# Patient Record
Sex: Female | Born: 1940 | Race: White | Hispanic: No
Health system: Midwestern US, Community
[De-identification: ages and names within clinical notes are randomized; demographics above are authoritative.]

## PROBLEM LIST (undated history)

## (undated) DIAGNOSIS — R339 Retention of urine, unspecified: Secondary | ICD-10-CM

## (undated) DIAGNOSIS — N9489 Other specified conditions associated with female genital organs and menstrual cycle: Secondary | ICD-10-CM

## (undated) DIAGNOSIS — J449 Chronic obstructive pulmonary disease, unspecified: Secondary | ICD-10-CM

## (undated) DIAGNOSIS — K5900 Constipation, unspecified: Secondary | ICD-10-CM

## (undated) DIAGNOSIS — M1990 Unspecified osteoarthritis, unspecified site: Secondary | ICD-10-CM

## (undated) DIAGNOSIS — R011 Cardiac murmur, unspecified: Secondary | ICD-10-CM

## (undated) DIAGNOSIS — M549 Dorsalgia, unspecified: Secondary | ICD-10-CM

## (undated) HISTORY — DX: Chronic obstructive pulmonary disease, unspecified: J44.9

## (undated) HISTORY — DX: Dorsalgia, unspecified: M54.9

## (undated) HISTORY — PX: PARTIAL COLECTOMY: SHX5273

## (undated) HISTORY — PX: LUMBAR SPINE SURGERY: SHX701

## (undated) HISTORY — PX: EPIDURAL BLOCK INJECTION: SHX1516

## (undated) HISTORY — PX: HEMORROIDECTOMY: SUR656

---

## 2007-11-03 ENCOUNTER — Encounter: Payer: Self-pay | Admitting: Internal Medicine

## 2007-11-07 ENCOUNTER — Ambulatory Visit (HOSPITAL_COMMUNITY): Admission: RE | Admit: 2007-11-07 | Discharge: 2007-11-07 | Payer: Self-pay | Admitting: Interventional Radiology

## 2007-11-07 ENCOUNTER — Encounter (INDEPENDENT_AMBULATORY_CARE_PROVIDER_SITE_OTHER): Payer: Self-pay | Admitting: Interventional Radiology

## 2008-04-27 ENCOUNTER — Ambulatory Visit (HOSPITAL_COMMUNITY): Admission: RE | Admit: 2008-04-27 | Discharge: 2008-04-27 | Payer: Self-pay | Admitting: Urology

## 2009-04-25 ENCOUNTER — Ambulatory Visit (HOSPITAL_COMMUNITY): Admission: RE | Admit: 2009-04-25 | Discharge: 2009-04-25 | Payer: Self-pay | Admitting: Interventional Radiology

## 2009-05-02 ENCOUNTER — Ambulatory Visit (HOSPITAL_COMMUNITY): Admission: RE | Admit: 2009-05-02 | Discharge: 2009-05-02 | Payer: Self-pay | Admitting: Interventional Radiology

## 2010-02-05 ENCOUNTER — Encounter: Payer: Self-pay | Admitting: Interventional Radiology

## 2010-03-29 ENCOUNTER — Ambulatory Visit (INDEPENDENT_AMBULATORY_CARE_PROVIDER_SITE_OTHER): Payer: Medicare Other | Admitting: Internal Medicine

## 2010-03-29 DIAGNOSIS — R1032 Left lower quadrant pain: Secondary | ICD-10-CM

## 2010-03-29 DIAGNOSIS — R1031 Right lower quadrant pain: Secondary | ICD-10-CM

## 2010-04-11 ENCOUNTER — Ambulatory Visit (HOSPITAL_COMMUNITY)
Admission: RE | Admit: 2010-04-11 | Discharge: 2010-04-11 | Disposition: A | Discharge status: Home / Self Care | Payer: Medicare Other | Source: Ambulatory Visit | Attending: Internal Medicine | Admitting: Internal Medicine

## 2010-04-11 ENCOUNTER — Encounter (HOSPITAL_BASED_OUTPATIENT_CLINIC_OR_DEPARTMENT_OTHER): Payer: Medicare Other | Admitting: Internal Medicine

## 2010-04-11 ENCOUNTER — Ambulatory Visit (HOSPITAL_COMMUNITY): Payer: Medicare Other

## 2010-04-11 DIAGNOSIS — E785 Hyperlipidemia, unspecified: Secondary | ICD-10-CM | POA: Insufficient documentation

## 2010-04-11 DIAGNOSIS — R1031 Right lower quadrant pain: Secondary | ICD-10-CM | POA: Insufficient documentation

## 2010-04-11 DIAGNOSIS — Z9049 Acquired absence of other specified parts of digestive tract: Secondary | ICD-10-CM | POA: Insufficient documentation

## 2010-04-11 DIAGNOSIS — K644 Residual hemorrhoidal skin tags: Secondary | ICD-10-CM | POA: Insufficient documentation

## 2010-04-11 DIAGNOSIS — R1032 Left lower quadrant pain: Secondary | ICD-10-CM

## 2010-04-11 DIAGNOSIS — R198 Other specified symptoms and signs involving the digestive system and abdomen: Secondary | ICD-10-CM

## 2010-04-14 ENCOUNTER — Other Ambulatory Visit (HOSPITAL_COMMUNITY): Payer: Medicare Other

## 2010-04-18 ENCOUNTER — Ambulatory Visit (INDEPENDENT_AMBULATORY_CARE_PROVIDER_SITE_OTHER): Payer: Medicare Other | Admitting: Internal Medicine

## 2010-04-23 NOTE — Op Note (Signed)
Jody [name], Jody [name]               ACCOUNT NO.:  [id number]  MEDICAL RECORD NO.:  [id number]           PATIENT TYPE:  O  LOCATION:  DAYP                          FACILITY:  [facility]  PHYSICIAN:  Lionel December, M.D.    DATE OF BIRTH:  08-Jan-1941  DATE OF PROCEDURE:  04/11/2010 DATE OF DISCHARGE:                              OPERATIVE REPORT   PROCEDURE:  Flexible sigmoidoscopy.  INDICATIONS:  Jody [name] is a 70 year old Caucasian female who had a subdural colectomy for chronic constipation about 2-1/2 half years ago who has been experiencing recurrent left lower quadrant abdominal pain and she is having multiple bowel movements.  She has had a couple of abdominal and pelvic CTs, which have been unremarkable.  She is undergoing diagnostic examination to make sure she does not have anastomotic stricture, which might explain some or all of her symptoms. Procedures were reviewed with the patient.  Informed consent was obtained.  MEDICATIONS FOR CONSCIOUS SEDATION:  Demerol 25 mg IV, Versed 2 mg IV.  FINDINGS:  Procedure performed in endoscopy suite.  The patient's vital signs and O2 sat were monitored during the procedure and remained stable.  The patient was placed in left lateral recumbent position. Rectal examination was performed.  She complained of significant pain. I therefore decided to give her Versed and Demerol at this point.  Even though she complained of pain, no mass or stricture was noted on digital exam.  Pentax videoscope was placed through rectum.  Mucosa of the rectum was partly colored with formed stool.  Scope was advanced into distal sigmoid colon.  Ileocolonic anastomosis was easily identified and was wide open.  The distal small bowel mucosa was unremarkable and formed stool was also noted in distal small bowel.  As the scope was withdrawn, colonic mucosa was examined as best as possible.  Examination was limited because of presence of formed stool coating the  mucosa. Please note that the patient was given 2 Fleet enemas before she came to facility.  Back in the rectum, I was able to retroflex the scope to examine anorectal junction and hemorrhoids noted below the dentate line. Endoscope was withdrawn.  The patient tolerated the procedure well.  FINAL DIAGNOSES: 1. Patent ileocolonic anastomosis, located just past the rectosigmoid     junction. 2. Formed stool in the distal small bowel as well as rectum indicating     poor evacuation or impaired defecation. 3. External hemorrhoids. 4. I suspect the patient's pain is referred pain from her back.  Need     to rule out nerve root entrapment, etc.  RECOMMENDATIONS:  She will resume her usual medications.  High-fiber diet plus fiber supplement 3-4 g daily.  The patient advised to take 1 mg Imodium after her first bowel movement every morning.  We will schedule for MRI of lumbar spine without contrast and plan to see her back in the office after this study has been completed.     Lionel December, M.D.     NR/MEDQ  D:  04/11/2010  T:  04/11/2010  Job:  [id number]  cc:   Kristian Covey, [facility]  Electronically Signed  by Lionel December M.D. on 04/23/2010 02:00:19 PM

## 2010-05-22 NOTE — Consult Note (Signed)
Jody [name], Jody [name]               ACCOUNT NO.:  [id number]  MEDICAL RECORD NO.:  [id number]           PATIENT TYPE:  LOCATION:                                 FACILITY:  PHYSICIAN:  Lionel December, M.D.    DATE OF BIRTH:  December 01, 1940  DATE OF CONSULTATION: DATE OF DISCHARGE:                                CONSULTATION   REFERRING PHYSICIAN:  Ernst Breach, PA-C in [place].  REASON FOR CONSULTATION:  Chronic abdominal pain and this is an office visit.  HISTORY OF PRESENT ILLNESS:  Jody [name] is a 70 year old female presenting today as a referral from Kristian Covey, PA-C for chronic abdominal pain.  Jody [name] has had this left lower quadrant pain for over 2 years since undergoing a colectomy in 2009 by Dr. Cleotis Nipper.  She underwent an ileosigmoid anastomosis for obstipation.  She was actually seen in March 2011 for the same pain and she did undergo a CT of the abdomen and pelvis on January 06, 2010, which revealed increased moderate left hydronephrosis and ureterectasis of distal ureter, mild urothelial enhancement near the left ureterovesical junction may represent an obstructing lesion.  She was followed up with Dr. Lionel December.  Her last colonoscopy was on January 26, 2006 by Dr. Cleotis Nipper, which revealed a normal rectum.  Normal colon to the splenic flexure. She had a tortuous and redundant sigmoid colon.  She says her appetite is good.  Her acid reflux when she has it is controlled with milk of magnesia.  She usually has three stools a day and they are formed and normal.  She denies any melena or rectal bleeding.  She says she has had some weight loss.  She does describe her abdominal pain as a pulling sensation.  In September 2011, she saw Dr. Cristy Folks in [place] for her pain. In his notes, all of her ultrasounds and CTs revealed no abnormalities.  HOME MEDICATIONS:  Lorazepam 1 mg at night and alendronate 70 mg once a week.  PHYSICAL EXAMINATION:  VITAL SIGNS:  Her  weight is 99, her last weight in our office on March 26, 2010 was 105.  She has had a 6-pound weight loss.  Her height is 5 feet and 6 inches, her temperature is 97.6, her blood pressure is 88/58 and her pulse is 76. HEENT:  She is edentulous.  Her oral mucosa is moist.  There are no lesions.  Her conjunctivae is pink.  Her sclerae are anicteric.  Her thyroid is normal. NECK:  There is no cervical lymphadenopathy. LUNGS:  Her lungs are clear. ABDOMEN:  Her abdomen is soft.  Bowel sounds are positive.  There is no guarding on palpation of her abdomen.  She does say there is tenderness just to the left of her umbilicus.  There is no edema to her lower extremities.  On January 06, 2010, her glucose was 128, calcium 9.1, total bilirubin was 0.6, AST 20, ALT 14, ALP 54, total protein 6.8, albumin 4.2, amylase 62, lipase 25.  WBC count was 9.2, hemoglobin 13.4, hematocrit was 40.9, MCV was 91.8.  She will call with a progress report in  2 weeks.  ASSESSMENT:  Jody [name] is a 70 year old female with complaints of chronic left lower abdominal pain ever since undergoing her surgery in 2009.  She did have an ileosigmoid anastomosis by Dr. Cleotis Nipper for obstipation.  This appears to be a chronic lower abdominal pain.  RECOMMENDATIONS:  I would like for her to may be try Dicyclomine 10 mgs  before breakfast and evening meal and I will discuss this case with  Dr. Karilyn Cota in the week.  The patient does not appear to be in any distress.    ______________________________ Dorene Ar, NP   ______________________________ Lionel December, M.D.    TS/MEDQ  D:  03/29/2010  T:  03/29/2010  Job:  [id number]  Electronically Signed by Dorene Ar PA on 05/15/2010 09:44:25 AM Electronically Signed by Lionel December M.D. on 05/22/2010 12:16:51 PM

## 2010-05-30 NOTE — Consult Note (Signed)
NAMENAJAE, FILSAIME               ACCOUNT NO.:  [id number]   MEDICAL RECORD NO.:  [id number]          PATIENT TYPE:  OUT   LOCATION:  XRAY                         FACILITY:  [facility]   PHYSICIAN:  Sanjeev K. Deveshwar, M.D.DATE OF BIRTH:  08-Feb-1940   DATE OF CONSULTATION:  11/03/2007  DATE OF DISCHARGE:                                 CONSULTATION   CHIEF COMPLAINT:  L3 compression fracture.   HISTORY OF PRESENT ILLNESS:  This is a pleasant 70 year old female  referred to Dr. Corliss Skains through the courtesy of Dr. Gerhard Munch for  evaluation of back pain.  The patient states at approximately 1 month  ago, she bent over to pick up her vacuum cleaner and heard a loud pop  after which she developed severe back pain.  She had an MRI performed on  October 10, 2007, that did show an L3 compression fracture felt to be  acute or subacute, this had associated paraspinal muscle edema.  She was  also noted to have degenerative disk disease at L5-S1.  The patient  states the pain radiates down her back and into her right hip.  She has  been very sedentary secondary to the pain.  She has been unable to do  much of anything other than ambulate short distances.  She initially was  treated with Lortab, however, this caused severe constipation.  She now  takes Darvocet one b.i.d., but states that this provides very little  relief from her pain.  She has been unable to sleep or do any household  chores.  She presents today accompanied by her husband and son to be  evaluated by Dr. Corliss Skains.   PAST MEDICAL HISTORY:  The patient has a history of ongoing tobacco use.  She reports hypotension.  She has occasional headaches.  She has COPD  and gastroesophageal reflux disease.  She states she has an enlarged  gallbladder and uterus.  She has had some dysuria.  She has a history of  a heart murmur.  She states she has Rh negative blood with a history of  being a free bleeder.  She does have an indwelling PICC  line.  She has  osteoporosis.  She had back surgery 20 years ago.  She also had recent  colon surgery in April and denies any bleeding problems associated with  that surgery.   SURGICAL HISTORY:  The patient had colon surgery in April of this year.  She has had left ankle surgery, left wrist surgery, and back surgery.  She denies any previous problems with anesthesia.   ALLERGIES:  1. PENICILLIN.  2. MORPHINE.  3. CECLOR.  4. CIPRO.  5. SULFA.  6. CODEINE.   She reports a recent reaction to what she believes with was DYE during  her MRI.  They consisted of a burning sensation of the skin and hives.   CURRENT MEDICATIONS:  Kapidex, Claritin p.r.n., Fosamax every Friday,  and Lortab was stopped.  She does take Darvocet and lorazepam.   SOCIAL HISTORY:  The patient is married.  She has 4 children.  She and  her husband live  in [place].  She has a history of ongoing tobacco use.  She smokes just less than a pack a day and has done so since age 16.  She is on disability.   FAMILY HISTORY:  Mother died at age 70, from what the patient describes  is dehydration.  Her father is alive at age 4.  His health status is  unknown.   IMPRESSION AND PLAN:  As noted, the patient presents today for further  evaluation of an L3 compression fracture, which has caused severe pain  and disability.  Dr. Corliss Skains reviewed the results of her MRI, which  was performed at [facility].  He pointed out the compression  fracture to the patient and her family.  He felt that she would be an  appropriate candidate for a kyphoplasty.   The kyphoplasty procedure was described in great detail along with the  associated risks and benefits as well as other treatment options such as  a more conservative therapy with sedentary lifestyle and pain  medication.  The patient and her family were shown videos describing  osteoporosis, compression fractures, and the kyphoplasty procedure.  They were also given  some written materials to review at home.  All of  their questions were answered.  The patient would like to proceed with  the kyphoplasty, which has tentatively been scheduled for this Friday,  November 07, 2007.  Dr. Corliss Skains would like to check a clotting time due  to the patient's history of free bleeding.  She may also need  prophylaxis for a history of MRI DYE allergy.   Greater than 40 minutes was spent on this consult.      Delton See, P.A.    ______________________________  Grandville Silos. Corliss Skains, M.D.    DR/MEDQ  D:  11/03/2007  T:  11/04/2007  Job:  [id number]

## 2010-06-20 ENCOUNTER — Other Ambulatory Visit (HOSPITAL_COMMUNITY): Payer: Self-pay | Admitting: Interventional Radiology

## 2010-06-20 DIAGNOSIS — M549 Dorsalgia, unspecified: Secondary | ICD-10-CM

## 2010-06-23 ENCOUNTER — Ambulatory Visit (HOSPITAL_COMMUNITY)
Admission: RE | Admit: 2010-06-23 | Discharge: 2010-06-23 | Disposition: A | Discharge status: Home / Self Care | Payer: Medicare Other | Source: Ambulatory Visit | Attending: Interventional Radiology | Admitting: Interventional Radiology

## 2010-06-23 ENCOUNTER — Other Ambulatory Visit (HOSPITAL_COMMUNITY): Payer: Self-pay | Admitting: Interventional Radiology

## 2010-06-23 DIAGNOSIS — M549 Dorsalgia, unspecified: Secondary | ICD-10-CM

## 2010-06-27 ENCOUNTER — Other Ambulatory Visit (HOSPITAL_COMMUNITY): Payer: Self-pay | Admitting: Interventional Radiology

## 2010-06-27 ENCOUNTER — Ambulatory Visit (HOSPITAL_COMMUNITY)
Admission: RE | Admit: 2010-06-27 | Discharge: 2010-06-27 | Disposition: A | Discharge status: Home / Self Care | Payer: Medicare Other | Source: Ambulatory Visit | Attending: Interventional Radiology | Admitting: Interventional Radiology

## 2010-06-27 DIAGNOSIS — I700 Atherosclerosis of aorta: Secondary | ICD-10-CM | POA: Insufficient documentation

## 2010-06-27 DIAGNOSIS — M79609 Pain in unspecified limb: Secondary | ICD-10-CM | POA: Insufficient documentation

## 2010-06-27 DIAGNOSIS — K7689 Other specified diseases of liver: Secondary | ICD-10-CM | POA: Insufficient documentation

## 2010-06-27 DIAGNOSIS — R109 Unspecified abdominal pain: Secondary | ICD-10-CM | POA: Insufficient documentation

## 2010-06-27 LAB — CREATININE, SERUM
Creatinine, Ser: 0.64 mg/dL (ref 0.4–1.2)
GFR calc Af Amer: >60 mL/min
GFR calc non Af Amer: >60 mL/min

## 2010-06-27 LAB — BUN: BUN: 22 mg/dL (ref 6–23)

## 2010-06-27 MED ORDER — IOHEXOL 300 MG/ML  SOLN
150.0000 mL | Freq: Once | INTRAMUSCULAR | Status: AC | PRN
  Administered 2010-06-27: 150 mL via INTRAVENOUS

## 2010-06-30 ENCOUNTER — Other Ambulatory Visit (HOSPITAL_COMMUNITY): Payer: Self-pay | Admitting: Interventional Radiology

## 2010-06-30 DIAGNOSIS — M549 Dorsalgia, unspecified: Secondary | ICD-10-CM

## 2010-07-07 ENCOUNTER — Ambulatory Visit (HOSPITAL_COMMUNITY): Payer: Medicare Other

## 2010-07-07 ENCOUNTER — Ambulatory Visit (HOSPITAL_COMMUNITY)
Admission: RE | Admit: 2010-07-07 | Discharge: 2010-07-07 | Disposition: A | Discharge status: Home / Self Care | Payer: Medicare Other | Source: Ambulatory Visit | Attending: Interventional Radiology | Admitting: Interventional Radiology

## 2010-07-07 DIAGNOSIS — M549 Dorsalgia, unspecified: Secondary | ICD-10-CM

## 2010-07-07 DIAGNOSIS — M519 Unspecified thoracic, thoracolumbar and lumbosacral intervertebral disc disorder: Secondary | ICD-10-CM | POA: Insufficient documentation

## 2010-07-07 DIAGNOSIS — M51379 Other intervertebral disc degeneration, lumbosacral region without mention of lumbar back pain or lower extremity pain: Secondary | ICD-10-CM | POA: Insufficient documentation

## 2010-07-07 DIAGNOSIS — M5126 Other intervertebral disc displacement, lumbar region: Secondary | ICD-10-CM | POA: Insufficient documentation

## 2010-07-07 DIAGNOSIS — M5137 Other intervertebral disc degeneration, lumbosacral region: Secondary | ICD-10-CM | POA: Insufficient documentation

## 2010-10-17 LAB — BASIC METABOLIC PANEL
CO2: 28
Chloride: 105
GFR calc Af Amer: >60
Glucose, Bld: 90
Potassium: 4.3
Sodium: 139

## 2010-10-17 LAB — CBC
HCT: 40.8
Hemoglobin: 13.6
MCHC: 33.4
MCV: 91.9
RDW: 14.8
WBC: 3.3 — ABNORMAL LOW

## 2010-10-17 LAB — PROTIME-INR: INR: 1

## 2010-12-11 ENCOUNTER — Telehealth (INDEPENDENT_AMBULATORY_CARE_PROVIDER_SITE_OTHER): Payer: Self-pay | Admitting: *Deleted

## 2010-12-11 NOTE — Telephone Encounter (Signed)
Needs to talk to Terri. Please return the call to [phone]. She has a question about her tcs that was done in March of 2012.

## 2010-12-11 NOTE — Telephone Encounter (Signed)
This is for Dr. Rehman 

## 2010-12-11 NOTE — Telephone Encounter (Signed)
I called the patient and she states that she had Colonoscopy in March. Son told her that Dr. Karilyn Cota told her she did not have cancer but that she had  a disc problem with her back and would need a MRI. She went to see to [facility] and they placed cement in her back. States that Camelia Eng called her a few months ago and told she needed to see a Careers adviser for hemorrhoids. She saw Dr. Doylene Canning and he refused to take care of her because she let Dr. Karilyn Cota do her Colonoscopy. He told her to come back to Dr. Karilyn Cota, patient states that she has not heard anything from Dr. Karilyn Cota since procedure. Patient c/o horrible pain (rectal) and sit on a heating pad all the time to help with this problem.

## 2010-12-11 NOTE — Telephone Encounter (Signed)
This is for Dr. Karilyn Cota. I have not seen this patient.

## 2010-12-12 ENCOUNTER — Ambulatory Visit (INDEPENDENT_AMBULATORY_CARE_PROVIDER_SITE_OTHER): Payer: Self-pay | Admitting: Surgery

## 2010-12-12 NOTE — Telephone Encounter (Signed)
Scheduled an apt for 12/19/10 at 10:00 am.

## 2010-12-19 ENCOUNTER — Ambulatory Visit (INDEPENDENT_AMBULATORY_CARE_PROVIDER_SITE_OTHER): Payer: Medicare Other | Admitting: Internal Medicine

## 2011-01-24 ENCOUNTER — Ambulatory Visit (INDEPENDENT_AMBULATORY_CARE_PROVIDER_SITE_OTHER): Payer: Medicare Other | Admitting: Internal Medicine

## 2011-01-24 ENCOUNTER — Encounter (INDEPENDENT_AMBULATORY_CARE_PROVIDER_SITE_OTHER): Payer: Self-pay | Admitting: Internal Medicine

## 2011-01-24 DIAGNOSIS — J449 Chronic obstructive pulmonary disease, unspecified: Secondary | ICD-10-CM | POA: Insufficient documentation

## 2011-01-24 DIAGNOSIS — M549 Dorsalgia, unspecified: Secondary | ICD-10-CM | POA: Insufficient documentation

## 2011-01-24 DIAGNOSIS — R1013 Epigastric pain: Secondary | ICD-10-CM

## 2011-01-24 MED ORDER — OMEPRAZOLE 20 MG PO CPDR: 20.0000 mg | DELAYED_RELEASE_CAPSULE | Freq: Two times a day (BID) | ORAL | Status: DC

## 2011-01-24 NOTE — Patient Instructions (Addendum)
H.pylori today. Omeprazole 20mg  Bid. Continue Preparation H. Progress report in 2 weeks.

## 2011-01-24 NOTE — Progress Notes (Addendum)
Subjective:     Patient ID: Jody [name], female   DOB: 01/12/1941, 71 y.o.   MRN: [id number]  HPI Jody [name] is a 71 yr old female presenting with c/o epigastric pain and umbilical pain.  She also tells me her hemorrhoids are giving her problems. She has been using Preparation H which has helped. Yesterday she had 7 BMs. Some were formed and some were loose. She has had 2 stools today and were formed. Hemorrhoids are better today. Appetite   04/11/2010 Flexible Sigmoid: Dr. Karilyn Cota:   FINAL DIAGNOSES:  1. Patent ileocolonic anastomosis, located just past the rectosigmoid  junction.  2. Formed stool in the distal small bowel as well as rectum indicating  poor evacuation or impaired defecation.  3. External hemorrhoids.  4. I suspect the patient's pain is referred pain from her back. Need  to rule out nerve root entrapment, etc.   2009 Dr. Cleotis Nipper: ileosigmoid anastomosis for obstipation.  September 2011 Dr. Cristy Folks for abdominal pain: [place] and CT revealed no abnormality. Review of Systems see hpi Current Outpatient Prescriptions  Medication Sig Dispense Refill  . LORazepam (ATIVAN) 1 MG tablet Take 1 mg by mouth Nightly.       Past Surgical History  Procedure Date  . Epidural block injection     a few ago in [place]  . Partial colectomy     for constipation   History   Social History  . Marital Status: Legally Separated    Spouse Name: N/A    Number of Children: N/A  . Years of Education: N/A   Occupational History  . Not on file.   Social History Main Topics  . Smoking status: Current Everyday Smoker  . Smokeless tobacco: Not on file   Comment: 1/2 pack a day since age 27.  Marland Kitchen Alcohol Use: No  . Drug Use: No  . Sexually Active: Not on file   Other Topics Concern  . Not on file   Social History Narrative  . No narrative on file   Family Status  Relation Status Death Age  . Mother Deceased     dehydration  . Father Deceased     prostate cancer  . Sister Deceased      1/2 sister with colon cancer age 53  . Brother Alive     1/2 brother in good health. One brother was murdered   Allergies  Allergen Reactions  . Ceclor (Cefaclor)   . Codeine   . Dimetapp (Albertsons Di Bromm)   . Penicillins   . Sulfa Antibiotics            Objective:   Physical Exam  Filed Vitals:   01/24/11 1439  Height: 5\' 6"  (1.676 m)  Weight: 92 lb 8 oz (41.958 kg)    Alert and oriented. Skin warm and dry. Oral mucosa is moist.   . Sclera anicteric, conjunctivae is pink. Thyroid not enlarged. No cervical lymphadenopathy. Lungs clear. Heart regular rate and rhythm.  Abdomen is soft. Bowel sounds are positive. No hepatomegaly. No abdominal masses felt. Epigastric and pain umbilicus.  Stool brown and guaiac negative.   No edema to lower extremities. Patient is alert and oriented.     Assessment:    Epigastric pain probably GERD.       Plan:    Omeprazole 20mg  BID.  ,. Continue the Preparation H.  Will also check an H. Pylori on her today.  Progress report in 2 weeks.

## 2011-01-25 LAB — H. PYLORI ANTIBODY, IGG: H Pylori IgG: 0.51 {ISR}

## 2011-01-26 ENCOUNTER — Other Ambulatory Visit (INDEPENDENT_AMBULATORY_CARE_PROVIDER_SITE_OTHER): Payer: Self-pay | Admitting: Internal Medicine

## 2011-01-26 DIAGNOSIS — R350 Frequency of micturition: Secondary | ICD-10-CM

## 2011-01-26 MED ORDER — CIPROFLOXACIN HCL 500 MG PO TABS: 500.0000 mg | ORAL_TABLET | Freq: Two times a day (BID) | ORAL | Status: AC

## 2015-02-25 ENCOUNTER — Other Ambulatory Visit: Payer: Self-pay | Admitting: Neurosurgery

## 2015-02-25 DIAGNOSIS — M47816 Spondylosis without myelopathy or radiculopathy, lumbar region: Secondary | ICD-10-CM

## 2015-03-21 ENCOUNTER — Other Ambulatory Visit: Payer: Medicare Other

## 2016-02-07 ENCOUNTER — Encounter (INDEPENDENT_AMBULATORY_CARE_PROVIDER_SITE_OTHER): Payer: Self-pay | Admitting: Internal Medicine

## 2016-02-07 ENCOUNTER — Encounter (INDEPENDENT_AMBULATORY_CARE_PROVIDER_SITE_OTHER): Payer: Self-pay

## 2016-02-16 ENCOUNTER — Encounter (INDEPENDENT_AMBULATORY_CARE_PROVIDER_SITE_OTHER): Payer: Self-pay | Admitting: Internal Medicine

## 2016-02-16 ENCOUNTER — Ambulatory Visit (INDEPENDENT_AMBULATORY_CARE_PROVIDER_SITE_OTHER): Payer: Medicare Other | Admitting: Internal Medicine

## 2016-02-16 VITALS — BP 94/60 | HR 72 | Temp 98.6°F | Ht 65.0 in | Wt 106.6 lb

## 2016-02-16 DIAGNOSIS — K6289 Other specified diseases of anus and rectum: Secondary | ICD-10-CM | POA: Diagnosis not present

## 2016-02-16 NOTE — Progress Notes (Addendum)
   Subjective:    Patient ID: Jody [name], female    DOB: 12-25-1940, 76 y.o.   MRN: [id number]  HPI Referred by Dr. Wenda Overland for rectal pain . Nine to 10 years she had a subtotal colectomy for constipation. . She says Dr. Lindalou Hose did the surgery. She say Dr. Ladona Horns and had an EGD. (I will get  Those records).  Has seen Dr. Britta Mccreedy in the past with an extensive workup. Per Dr. Britta Mccreedy records hx of chronic constipation and underwent a subtotal colectomy 7 yrs. Marland Kitchen  Hx of Melnick Fraser Syndrome. She tells me when she has a BM, she swells in her rectum. She has pain when she walks. She has a BM every other day.  Appetite is good. She is not on a stool softener. She underwent a flex sigmoid 09/02/2015. There was evidence of prior surgical anastomosis in the rectum.  A normal descending, sigmoid, and rectum appeared norma.  02/10/2016 H and H 14.5 and 44.5 11/15/2015 US pelvis LLQ pain: Normal pelvic US   10/31/2013 EGD: abdominal pain: Dr. Sherlene Shams Normal.  A subepithelial lesion was located 16-18 cm from the incisors.  Retroflexed views revealed a hiatal hernia.  Review of Systems Past Medical History:  Diagnosis Date  . Back pain   . COPD (chronic obstructive pulmonary disease) ([place])     Past Surgical History:  Procedure Laterality Date  . EPIDURAL BLOCK INJECTION     a few ago in [place]  . PARTIAL COLECTOMY     for constipation    Allergies  Allergen Reactions  . Ceclor [Cefaclor]   . Codeine   . Dimetapp [Albertsons Di Bromm]   . Morphine And Related   . Penicillins   . Sulfa Antibiotics     Current Outpatient Prescriptions on File Prior to Visit  Medication Sig Dispense Refill  . LORazepam (ATIVAN) 1 MG tablet Take 1 mg by mouth Nightly.    Marland Kitchen omeprazole (PRILOSEC) 20 MG capsule Take 1 capsule (20 mg total) by mouth 2 (two) times daily. 60 capsule 3   No current facility-administered medications on file prior to visit.        Objective:   Physical Exam Blood  pressure 94/60, pulse 72, temperature 98.6 F (37 C), height 5\' 5"  (1.651 m), weight 106 lb 9.6 oz (48.4 kg).  Alert and oriented. Skin warm and dry. Oral mucosa is moist.   . Sclera anicteric, conjunctivae is pink. Thyroid not enlarged. No cervical lymphadenopathy. Lungs clear. Heart regular rate and rhythm.  Abdomen is soft. Bowel sounds are positive. No hepatomegaly. No abdominal masses felt. No tenderness.  No edema to lower extremities. Stool brown and guaiac negative.       Assessment & Plan:  Rectal pain. Stool was brown and guaiac negative. Has had this pain for year. Advised to use rectal cream that Dr. Wenda Overland prescribed. Am going to get records from Dr. Britta Mccreedy.

## 2016-02-16 NOTE — Patient Instructions (Signed)
Will get records from Dr. Britta Mccreedy.

## 2016-02-20 ENCOUNTER — Encounter (INDEPENDENT_AMBULATORY_CARE_PROVIDER_SITE_OTHER): Payer: Self-pay | Admitting: Internal Medicine

## 2016-02-29 ENCOUNTER — Ambulatory Visit (INDEPENDENT_AMBULATORY_CARE_PROVIDER_SITE_OTHER): Payer: Medicare Other | Admitting: Urology

## 2016-02-29 DIAGNOSIS — N3020 Other chronic cystitis without hematuria: Secondary | ICD-10-CM

## 2016-02-29 DIAGNOSIS — R339 Retention of urine, unspecified: Secondary | ICD-10-CM

## 2016-03-28 ENCOUNTER — Other Ambulatory Visit (HOSPITAL_COMMUNITY)
Admission: RE | Admit: 2016-03-28 | Discharge: 2016-03-28 | Disposition: A | Discharge status: Home / Self Care | Payer: Medicare Other | Source: Other Acute Inpatient Hospital | Attending: Urology | Admitting: Urology

## 2016-03-28 ENCOUNTER — Ambulatory Visit (INDEPENDENT_AMBULATORY_CARE_PROVIDER_SITE_OTHER): Payer: Medicare Other | Admitting: Urology

## 2016-03-28 DIAGNOSIS — R339 Retention of urine, unspecified: Secondary | ICD-10-CM | POA: Diagnosis not present

## 2016-03-28 DIAGNOSIS — N3000 Acute cystitis without hematuria: Secondary | ICD-10-CM

## 2016-03-31 LAB — URINE CULTURE

## 2016-07-23 ENCOUNTER — Ambulatory Visit: Admit: 2016-07-23 | Discharge: 2016-07-23 | Payer: MEDICARE | Attending: Urology

## 2016-07-23 DIAGNOSIS — R300 Dysuria: Secondary | ICD-10-CM

## 2016-07-23 LAB — AMB POC URINALYSIS DIP STICK AUTO W/ MICRO (MICRO RESULTS)
Bilirubin (UA POC): NEGATIVE
Blood (UA POC): NEGATIVE
Crystals (UA POC): NEGATIVE
Epithelial cells (UA POC): 0
Glucose (UA POC): NEGATIVE
Ketones (UA POC): NEGATIVE
Nitrites (UA POC): NEGATIVE
Protein (UA POC): NEGATIVE
RBCs (UA POC): 0
Specific gravity (UA POC): 1.02 (ref 1.001–1.035)
Urobilinogen (UA POC): 0.2 (ref 0.2–1)
WBCs (UA POC): 0
pH (UA POC): 6 (ref 4.6–8.0)

## 2016-07-23 NOTE — Progress Notes (Signed)
Kimberly Sherman is a 76 y.o. female presents today for a straight catheter insertion for urine culture per Dr. Cleda MccreedyHughart order.   Dr. Cleda MccreedyHughart was present in the clinic as incident to.     Visit Vitals   ??? BP (!) 80/60 (BP 1 Location: Left arm, BP Patient Position: Sitting)   ??? Pulse 68   ??? Temp 96.1 ??F (35.6 ??C) (Temporal)   ??? Resp 18   ??? Ht 5\' 7"  (1.702 m)   ??? Wt 93 lb (42.2 kg)   ??? BMI 14.57 kg/m2        Procedure:   The genital and perineal areas were cleansed with antiseptic solution on the cotton balls.  With clean gloves the area was cleansed with antiseptic solution again.  I applied sterile lubricant liberally to the catheter tip, lubricating at least six inches of the catheter.  A 14 fr cath catheter was inserted into the urinary meatus. When urine started to flow, I inserted the catheter approximately one inch further without difficulty and bladder drained.      Catheter size:  14  Type:  Straight-tip   Material:  All Silicone    Urine was:   Cloudy  Specimen obtained:  YES    No results found for this or any previous visit.    Orders Placed This Encounter   ??? INSERT,NON-INDWELLING BLADDER CATHETER       Nadeen LandauNakia S Alira Fretwell

## 2016-07-23 NOTE — Progress Notes (Deleted)
.  techu

## 2016-07-23 NOTE — Progress Notes (Signed)
HISTORY OF PRESENT ILLNESS:  Kimberly Sherman is a 76 y.o. female who is seen in consultation as referred for c/o hesitancy. 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 Hx of microhematuria. Cysto and retrograde pyelograms done March 2017 was neg. Hx of renal failure. Pt reports that she saw a urologist in [place] approx 2 months ago and had a catheter placed, but was taught to do CIC TID. Pt reports that she has issues doing CIC because "it hurts." Has had problems with her bladder since her colectomy. Pt will be seeing GI this month.     Past Medical History:   Diagnosis Date   ??? Arthritis    ??? Bulging lumbar disc    ??? Chronic obstructive pulmonary disease (HCC)    ??? Depression    ??? GERD (gastroesophageal reflux disease)    ??? Hemorrhoids    ??? Urinary retention        Past Surgical History:   Procedure Laterality Date   ??? HX HEMORRHOIDECTOMY     ??? PART REMOVAL COLON W ANASTOMOSIS         Social History   Substance Use Topics   ??? Smoking status: Current Every Day Smoker     Packs/day: 1.00     Years: 52.00   ??? Smokeless tobacco: Never Used   ??? Alcohol use No       Allergies   Allergen Reactions   ??? Codeine Hives   ??? Dimetapp (Brompheniramine-Ppa) Unable to Obtain   ??? Penicillins Hives   ??? Sulfa (Sulfonamide Antibiotics) Nausea and Vomiting     No family history on file.    Current Outpatient Prescriptions   Medication Sig Dispense Refill   ??? promethazine (PHENERGAN) 50 mg tablet Take 50 mg by mouth every six (6) hours as needed for Nausea.     ??? IBUPROFEN PO Take 1 Tab by mouth three (3) times daily.     ??? propoxyphene nap/acetaminophen (DARVOCET-N 100 PO) Take 1 Tab by mouth two (2) times a day.     ??? raNITIdine (ZANTAC) 150 mg tablet Take 150 mg by mouth two (2) times a day.         Review of Systems  Constitutional: Fever: No  Skin: Rash: No  HEENT: Hearing difficulty: No   Eyes: Blurred vision: No  Cardiovascular: Chest pain: No  Respiratory: Shortness of breath: Yes  chronic  Gastrointestinal: Nausea/vomiting: No  Musculoskeletal: Back pain: No  Neurological: Weakness: No  Psychological: Memory loss: No  Comments/additional findings:       PHYSICAL EXAMINATION:     Visit Vitals   ??? BP (!) 80/60 (BP 1 Location: Left arm, BP Patient Position: Sitting)   ??? Pulse 68   ??? Temp 96.1 ??F (35.6 ??C) (Temporal)   ??? Resp 18   ??? Ht 5\' 7"  (1.702 m)   ??? Wt 93 lb (42.2 kg)   ??? BMI 14.57 kg/m2     Constitutional: Well developed, no acute distress.   Eyes:  Conjunctiva normal.  Ears:  External ear normal.  Nose/Throat:  External nose normal.  CV:  Heart rate regular. No peripheral swelling noted.  Respiratory: No respiratory distress. No audible wheeze.  Abdomen:  Soft, nontender, nondistended, no mass/organomegaly.  Vascular:  Abdominal Aorta nonpalpable.  Skin: No rash. No ulcer.    Neuro/Psych:  Patient with appropriate affect.  Alert and oriented x 3.    Gait:  Normal.  GU Female:      CVA: non-tender bilaterally. Bladder not palpable.      REVIEW OF LABS AND IMAGING:      Results for orders placed or performed in visit on 07/23/16   AMB POC URINALYSIS DIP STICK AUTO W/ MICRO (MICRO RESULTS)   Result Value Ref Range    Color (UA POC) Dark Yellow     Clarity (UA POC) Clear     Glucose (UA POC) Negative Negative    Bilirubin (UA POC) Negative Negative    Ketones (UA POC) Negative Negative    Specific gravity (UA POC) 1.020 1.001 - 1.035    Blood (UA POC) Negative Negative    pH (UA POC) 6.0 4.6 - 8.0    Protein (UA POC) Negative Negative    Urobilinogen (UA POC) 0.2 mg/dL 0.2 - 1    Nitrites (UA POC) Negative Negative    Leukocyte esterase (UA POC) 1+ Negative    Epithelial cells (UA POC) 0     WBCs (UA POC) 0      RBCs (UA POC) 0      Bacteria (UA POC) 3+ Negative    Crystals (UA POC) Negative Negative    Other (UA POC)     AMB POC PVR, MEAS,POST-VOID RES,US,NON-IMAGING   Result Value Ref Range     PVR 257 cc       ASSESSMENT:     ICD-10-CM ICD-9-CM    1. Dysuria R30.0 788.1 INSERT,NON-INDWELLING BLADDER CATHETER      AMB POC URINALYSIS DIP STICK AUTO W/ MICRO (MICRO RESULTS)      CUSTOMER REQUEST URINE CULTURE      CANCELED: INSERT,NON-INDWELLING BLADDER CATHETER   2. Neurogenic bladder N31.9 596.54 AMB POC PVR, MEAS,POST-VOID RES,US,NON-IMAGING   3. Retention of urine R33.9 788.20         PLAN:    ?? Offered to insert foley until Urodynamics since pt finds it difficult to do CIC, but pt does not want  ?? Cont CIC QID  ?? Schedule for Urodynamics   ?? Will try to get urology records    Patient's BMI is out of the normal parameters. Information about BMI was given and patient was advised to follow-up with their PCP for further management.    Chief Complaint   Patient presents with   ??? Urinary Retention     Medical documentation provided with the assistance of Isac SarnaKimber N Moore, medical scribe for [employer] LifeChristi Dashonna Chagnon, DO, [address]     Izack Hoogland [place], [place]

## 2016-07-24 ENCOUNTER — Encounter

## 2016-07-25 LAB — AMB POC PVR, MEAS,POST-VOID RES,US,NON-IMAGING: PVR: 257 cc

## 2016-07-25 NOTE — Telephone Encounter (Signed)
[facility] called for Knute NeuShirley Salasar, asking for a verbal for Ms. Cocuzza Straight catheters. Per Dr. Cleda MccreedyHughart she was given a one-month supply of Speedi-catheters.

## 2016-08-01 ENCOUNTER — Ambulatory Visit: Admit: 2016-08-01 | Discharge: 2016-08-01 | Payer: MEDICARE

## 2016-08-01 DIAGNOSIS — R339 Retention of urine, unspecified: Secondary | ICD-10-CM

## 2016-08-01 NOTE — Telephone Encounter (Signed)
Patient called asking why is her bladder not working and was expecting to have cystoscopy done today instead UDS. Patient was told that she was scheduled to look at her bladder neurology if its still working and will discus  the results with Dr. Cleda MccreedyHughart the day of her appointment.

## 2016-08-01 NOTE — Progress Notes (Addendum)
[facility]  Without Video- Fluoroscopy    Name: Kimberly LovelyShirley A Mates   Date: 08/01/2016      Kimberly Sherman is a 76 y.o. female who presents today for evaluation. Patient has been referred by Dr. Cleda MccreedyHughart.  Dr. Netta CorriganHughar is available in clinic as the incident to provider.        ICD-10-CM ICD-9-CM    1. Urinary retention R33.9 788.20 PR ANAL/URINARY MUSCLE STUDY      PR COMPLEX CYSTOMETROGRAM      CANCELED: PR COMPLEX CYSTOMETROGRAM VOIDING PRESSURE STUDIES      CANCELED: PR ELECTRO-UROFLOWMETRY, FIRST      CANCELED: PR VOIDING PRESS STUDY INTRA-ABDOMINAL VOID   2. Neurogenic bladder N31.9 596.54 PR ANAL/URINARY MUSCLE STUDY      PR COMPLEX CYSTOMETROGRAM      CANCELED: PR COMPLEX CYSTOMETROGRAM VOIDING PRESSURE STUDIES      CANCELED: PR ELECTRO-UROFLOWMETRY, FIRST      CANCELED: PR VOIDING PRESS STUDY INTRA-ABDOMINAL VOID     Orders Placed This Encounter   ??? ([id number]) - PR ANAL/URINARY MUSCLE STUDY   ??? ([id number]) - PR COMPLEX CYSTOMETROGRAM       Past Medical History:   Diagnosis Date   ??? Arthritis    ??? Bulging lumbar disc    ??? Chronic obstructive pulmonary disease (HCC)    ??? Depression    ??? GERD (gastroesophageal reflux disease)    ??? Hemorrhoids    ??? Urinary retention       Past Surgical History:   Procedure Laterality Date   ??? HX HEMORRHOIDECTOMY     ??? PART REMOVAL COLON W ANASTOMOSIS         Urine dipstick shows negative for all components.    Voiding Diary: not completed by patient            Uroflometry  Voided Volume 0 ml                   Post Void Residual 120 ml   Comments: PVR via catheter       The genital and perineal areas were cleansed with benzalkonium chloride and patient was catheterized with a 14 fr Straight-tip  catheter to assess PVR and absence of urinary tract infection.     A #7 fr Straight-tip urodynamics catheter was inserted into the bladder to infuse fluid and assess bladder activity and voiding parameters.  A #9 fr  catheter was inserted into the rectum to assess straining during voiding. Both catheters were connected to water filled lines to transducers that simultaneously measure pressure generated inside and outside the bladder during filling and voiding as well as resulting flow volume and time.      Three patch electrodes are placed, one ground and two perianally which are used to measure the electrical activity of the external urinary sphincter during filling and emptying the bladder.      Infusion with sterile water was started and the patient was asked to voice sensations of pressure, urge and bladder fullness, followed by instructions to void.     Results for orders placed or performed in visit on 07/23/16   AMB POC URINALYSIS DIP STICK AUTO W/ MICRO (MICRO RESULTS)   Result Value Ref Range    Color (UA POC) Dark Yellow     Clarity (UA POC) Clear     Glucose (UA POC) Negative Negative    Bilirubin (UA POC) Negative Negative    Ketones (UA POC) Negative Negative  Specific gravity (UA POC) 1.020 1.001 - 1.035    Blood (UA POC) Negative Negative    pH (UA POC) 6.0 4.6 - 8.0    Protein (UA POC) Negative Negative    Urobilinogen (UA POC) 0.2 mg/dL 0.2 - 1    Nitrites (UA POC) Negative Negative    Leukocyte esterase (UA POC) 1+ Negative    Epithelial cells (UA POC) 0     WBCs (UA POC) 0      RBCs (UA POC) 0      Bacteria (UA POC) 3+ Negative    Crystals (UA POC) Negative Negative    Other (UA POC)     AMB POC PVR, MEAS,POST-VOID RES,US,NON-IMAGING   Result Value Ref Range    PVR 257 cc        Orders Placed This Encounter   ??? ([id number]) - PR ANAL/URINARY MUSCLE STUDY   ??? ([id number]) - PR COMPLEX CYSTOMETROGRAM         Cystometrogram  Sensation Delayed   First sensation 154 ml   Strong desire 184 ml   Capacity 243 ml   Compliance: normal     Detrusor overactivity:yes     Leakage: no       Micturition  Voided Volume: 0 ml                                          Post Void Residual: 260 ml - via catheter      Comments: Patient reports that she has issues doing CIC because "it hurts." also her "pee hole" is swollen.  Per [facility] patient has a small capacity compliant bladder with delayed sensations.  DO noted during fill at 17 ml/H2O, 155 ml/H2O and 213 ml/H2O with no leakage.  Patient was not able to generate a bladder pressure detrusor contraction and was not able to void and she had an active EUS.    The Patient Instruction Sheet was reviewed with and given to patient who expressed understanding.  1 Cipor 500 ml tablet given PO to patient with instructions per UDS protocol.      Etheleen Sia, [place]        Follow-up Disposition:  Return in about 9 days (around 08/10/2016).   Reviewed by [facility], DO.      I have read the above study, reviewed the tracings, the clinical history, and agree with the findings as above.     Study of sufficient quality:Yes     Cysto completed: Yes Findings: Normal, 2017    Relevant prior surgery or neurologic disease:Yes, Pelvic surgery (colon)    Patient has small capacity bladder with detrusor overactivity noted (without leakage).  She came in with a relatively modest PVR of 120 cc, but during the study could not mount a detrusor contraction and left 240 cc remaining after her study.      Given the lack of ability to show a detrusor contraction, normal cystoscopy indicating no urethral stenosis or anatomic reason for obstruction, would suspect bladder hypoactivity as a cause of her retention.  Cannot exclude the possibility of poor bladder neck relaxation but without a detrusor contraction, impossible to determine.  Since she has little other options other than CIC or indwelling Foley if retention persists, could consider off label use of an alpha blocker in a female to aid bladder neck relaxation, and pelvic floor physiotherapy for her poor sphincter relaxation.  But again, even if she had issues of poor bladder neck relaxation and poor sphincter relaxation, without a detrusor  contraction, even correction of those issues, retention would likely persist.    Claudius Sis, MD

## 2016-08-10 ENCOUNTER — Encounter: Attending: Urology

## 2016-08-10 ENCOUNTER — Ambulatory Visit: Admit: 2016-08-10 | Discharge: 2016-08-10 | Payer: MEDICARE | Attending: Urology

## 2016-08-10 DIAGNOSIS — N319 Neuromuscular dysfunction of bladder, unspecified: Secondary | ICD-10-CM

## 2016-08-10 NOTE — Progress Notes (Signed)
Kimberly Sherman is a 76 y.o. female presents today for a straight catheter insertion for urine culture per Dr. Cleda MccreedyHughart order.   Dr. Cleda MccreedyHughart was present in the clinic as incident to.     Visit Vitals   ??? BP 104/66 (BP 1 Location: Left arm, BP Patient Position: Sitting)   ??? Pulse 71   ??? Temp 99 ??F (37.2 ??C) (Temporal)   ??? Resp 16   ??? Ht 5\' 6"  (1.676 m)   ??? Wt 89 lb (40.4 kg)   ??? LMP  (LMP Unknown)   ??? BMI 14.36 kg/m2        Procedure:   The genital and perineal areas were cleansed with antiseptic solution on the cotton balls.  With clean gloves the area was cleansed with antiseptic solution again.  I applied sterile lubricant liberally to the catheter tip, lubricating at least six inches of the catheter.  A 12 fr cath catheter was inserted into the urinary meatus. When urine started to flow, I inserted the catheter approximately one inch further without difficulty and bladder drained.      Catheter size:  12  Type:  Straight-tip   Material:  All Silicone    Urine was:   clear  Specimen obtained:  YES    Results for orders placed or performed in visit on 07/23/16   AMB POC URINALYSIS DIP STICK AUTO W/ MICRO (MICRO RESULTS)   Result Value Ref Range    Color (UA POC) Dark Yellow     Clarity (UA POC) Clear     Glucose (UA POC) Negative Negative    Bilirubin (UA POC) Negative Negative    Ketones (UA POC) Negative Negative    Specific gravity (UA POC) 1.020 1.001 - 1.035    Blood (UA POC) Negative Negative    pH (UA POC) 6.0 4.6 - 8.0    Protein (UA POC) Negative Negative    Urobilinogen (UA POC) 0.2 mg/dL 0.2 - 1    Nitrites (UA POC) Negative Negative    Leukocyte esterase (UA POC) 1+ Negative    Epithelial cells (UA POC) 0     WBCs (UA POC) 0      RBCs (UA POC) 0      Bacteria (UA POC) 3+ Negative    Crystals (UA POC) Negative Negative    Other (UA POC)     AMB POC PVR, MEAS,POST-VOID RES,US,NON-IMAGING   Result Value Ref Range    PVR 257 cc       Orders Placed This Encounter   ??? INSERT,NON-INDWELLING BLADDER CATHETER    ??? gabapentin (NEURONTIN) 300 mg capsule     Sig: Take 300 mg by mouth.   ??? promethazine (PHENERGAN) 25 mg tablet     Sig: Take 25 mg by mouth.       [employer]i Decie Verne, LPN

## 2016-08-10 NOTE — Progress Notes (Signed)
HISTORY OF PRESENT ILLNESS:  Kimberly Sherman is a 76 y.o. female who presents today for c/o hesitancy. 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 Hx of microhematuria. Cysto and retrograde pyelograms done March 2017 was neg. Hx of renal failure. Pt reports that she saw [place]ist in [place] approx 2 months ago and had a catheter placed, but was taught to do CIC TID. Pt reports that she has issues doing CIC because "it hurts." Has had problems with her bladder since her colectomy. Pt will be seeing GI this month.     UDS shows small capacity bladder, DO, no detrusor contraction.  Suspect may be due to pelvic surgery.  Only option is to continue cic.  Patient only doing cic bid.  No dysuria.  Patient saw GI recently as referred.      AUA Symptom Score 08/10/2016   Over the past month how often have you had the sensation that your bladder was not completely empty after you finished urinating? 2   Over the past month, how often have had to urinate again less than 2 hours after you last finished urinating? 2   Over the past month, how often have you found you stopped and started again several times when you urinated? 2   Over the past month, how often have you found it difficult to postpone urination? 0   Over the past month, how often have you had a weak urinary stream? 2   Over the past month, how often have you had to push or strain to begin urinating? 0   Over the past month, how many times did you most typically get up to urinate from the time you went to bed at night until the time you got up in the morning? 2   AUA Score 10   If you were to spend the rest of your life with your urinary condition the way it is now, how would you feel about that? Mostly satisfied       Past Medical History:   Diagnosis Date   ??? Arthritis    ??? Bulging lumbar disc     ??? Chronic obstructive pulmonary disease (HCC)    ??? Depression    ??? GERD (gastroesophageal reflux disease)    ??? Hemorrhoids    ??? Urinary retention        Past Surgical History:   Procedure Laterality Date   ??? HX HEMORRHOIDECTOMY     ??? PART REMOVAL COLON W ANASTOMOSIS         Social History   Substance Use Topics   ??? Smoking status: Current Every Day Smoker     Packs/day: 1.00     Years: 52.00   ??? Smokeless tobacco: Never Used   ??? Alcohol use No       Allergies   Allergen Reactions   ??? Codeine Hives   ??? Dimetapp (Brompheniramine-Ppa) Unable to Obtain   ??? Penicillins Hives   ??? Sulfa (Sulfonamide Antibiotics) Nausea and Vomiting       Family History   Problem Relation Age of Onset   ??? Cancer Mother    ??? Cancer Sister    ??? Cancer Daughter        Current Outpatient Prescriptions   Medication Sig Dispense Refill   ??? gabapentin (NEURONTIN) 300 mg capsule Take 300 mg  by mouth.     ??? promethazine (PHENERGAN) 25 mg tablet Take 25 mg by mouth.     ??? promethazine (PHENERGAN) 50 mg tablet Take 50 mg by mouth every six (6) hours as needed for Nausea.     ??? IBUPROFEN PO Take 1 Tab by mouth three (3) times daily.     ??? raNITIdine (ZANTAC) 150 mg tablet Take 150 mg by mouth two (2) times a day.     ??? propoxyphene nap/acetaminophen (DARVOCET-N 100 PO) Take 1 Tab by mouth two (2) times a day.           REVIEW OF SYSTEMS:  Constitutional: Fever: No  Skin: Rash: No  HEENT: Hearing difficulty: No  Eyes: Blurred vision: No  Cardiovascular: Chest pain: No  Respiratory: Shortness of breath: No  Gastrointestinal: Nausea/vomiting: Yes  Musculoskeletal: Back pain: No  Neurological: Weakness: No  Psychological: Memory loss: No    PHYSICAL EXAMINATION:   Visit Vitals   ??? BP 104/66 (BP 1 Location: Left arm, BP Patient Position: Sitting)   ??? Pulse 71   ??? Temp 99 ??F (37.2 ??C) (Temporal)   ??? Resp 16   ??? Ht 5\' 6"  (1.676 m)   ??? Wt 89 lb (40.4 kg)   ??? LMP  (LMP Unknown)   ??? BMI 14.36 kg/m2     Constitutional: Well developed, no acute distress.    Eyes:  Conjunctiva normal.  Ears:  External ear normal.  Nose/Throat:  External nose normal.  CV:  Heart rate regular, No peripheral swelling noted.  Respiratory: No respiratory distress, No audible wheeze.  Skin: No rash, No ulcer.    Neuro/Psych:  Patient with appropriate affect.  Alert and oriented x 3.    Gait:  Normal.     REVIEW OF LABS AND IMAGING:      Results for orders placed or performed in visit on 07/23/16   AMB POC URINALYSIS DIP STICK AUTO W/ MICRO (MICRO RESULTS)   Result Value Ref Range    Color (UA POC) Dark Yellow     Clarity (UA POC) Clear     Glucose (UA POC) Negative Negative    Bilirubin (UA POC) Negative Negative    Ketones (UA POC) Negative Negative    Specific gravity (UA POC) 1.020 1.001 - 1.035    Blood (UA POC) Negative Negative    pH (UA POC) 6.0 4.6 - 8.0    Protein (UA POC) Negative Negative    Urobilinogen (UA POC) 0.2 mg/dL 0.2 - 1    Nitrites (UA POC) Negative Negative    Leukocyte esterase (UA POC) 1+ Negative    Epithelial cells (UA POC) 0     WBCs (UA POC) 0      RBCs (UA POC) 0      Bacteria (UA POC) 3+ Negative    Crystals (UA POC) Negative Negative    Other (UA POC)     AMB POC PVR, MEAS,POST-VOID RES,US,NON-IMAGING   Result Value Ref Range    PVR 257 cc       ASSESSMENT:     ICD-10-CM ICD-9-CM    1. Neurogenic bladder N31.9 596.54 INSERT,NON-INDWELLING BLADDER CATHETER   2. Retention of urine R33.9 788.20    3. Weight loss R63.4 783.21               PLAN:    ?? Told to increase cic to qid  ?? Culture sent but asymptomatic  ?? Told only option is to continue cic  ??  F/u 3 wks to see if needs anticholinergic for spasms between cic  ?? Told to see pcp regarding weight loss  Patient's BMI is out of the normal parameters.  Information about BMI was given and patient was advised to follow-up with their PCP for further management.           Chief Complaint   Patient presents with   ??? (LUTS) Lower Urinary Tract Symptoms     UDS results          A copy of today's office visit was sent to the referring physician.        Medical documentation provided with the assistance of Isac Sarna, medical scribe for [employer], DO, [place].    Duard Spiewak [place], DO

## 2016-08-14 NOTE — Progress Notes (Signed)
asympt so no tx

## 2016-08-16 ENCOUNTER — Encounter: Attending: Urology

## 2016-09-05 ENCOUNTER — Encounter: Attending: Urology

## 2017-12-15 DIAGNOSIS — K5900 Constipation, unspecified: Secondary | ICD-10-CM

## 2017-12-15 HISTORY — DX: Constipation, unspecified: K59.00

## 2017-12-20 ENCOUNTER — Observation Stay (HOSPITAL_COMMUNITY): Payer: Medicare Other

## 2017-12-20 ENCOUNTER — Other Ambulatory Visit: Payer: Self-pay

## 2017-12-20 ENCOUNTER — Encounter (HOSPITAL_COMMUNITY): Payer: Self-pay

## 2017-12-20 ENCOUNTER — Observation Stay (HOSPITAL_COMMUNITY)
Admission: EM | Admit: 2017-12-20 | Discharge: 2017-12-21 | Disposition: A | Discharge status: Home / Self Care | Payer: Medicare Other | Attending: Internal Medicine | Admitting: Internal Medicine

## 2017-12-20 ENCOUNTER — Emergency Department (HOSPITAL_COMMUNITY): Payer: Medicare Other

## 2017-12-20 DIAGNOSIS — R1900 Intra-abdominal and pelvic swelling, mass and lump, unspecified site: Secondary | ICD-10-CM | POA: Insufficient documentation

## 2017-12-20 DIAGNOSIS — G8929 Other chronic pain: Secondary | ICD-10-CM

## 2017-12-20 DIAGNOSIS — R1030 Lower abdominal pain, unspecified: Secondary | ICD-10-CM | POA: Diagnosis present

## 2017-12-20 DIAGNOSIS — F17210 Nicotine dependence, cigarettes, uncomplicated: Secondary | ICD-10-CM | POA: Diagnosis not present

## 2017-12-20 DIAGNOSIS — Z87898 Personal history of other specified conditions: Secondary | ICD-10-CM

## 2017-12-20 DIAGNOSIS — Z79899 Other long term (current) drug therapy: Secondary | ICD-10-CM | POA: Insufficient documentation

## 2017-12-20 DIAGNOSIS — R339 Retention of urine, unspecified: Secondary | ICD-10-CM | POA: Diagnosis present

## 2017-12-20 DIAGNOSIS — K5909 Other constipation: Secondary | ICD-10-CM | POA: Diagnosis not present

## 2017-12-20 DIAGNOSIS — N319 Neuromuscular dysfunction of bladder, unspecified: Principal | ICD-10-CM | POA: Insufficient documentation

## 2017-12-20 DIAGNOSIS — J449 Chronic obstructive pulmonary disease, unspecified: Secondary | ICD-10-CM | POA: Diagnosis not present

## 2017-12-20 DIAGNOSIS — M549 Dorsalgia, unspecified: Secondary | ICD-10-CM

## 2017-12-20 HISTORY — DX: Unspecified osteoarthritis, unspecified site: M19.90

## 2017-12-20 HISTORY — DX: Constipation, unspecified: K59.00

## 2017-12-20 HISTORY — DX: Cardiac murmur, unspecified: R01.1

## 2017-12-20 LAB — CBC WITH DIFFERENTIAL/PLATELET
Abs Immature Granulocytes: 0.01 10*3/uL (ref 0.00–0.07)
Basophils Absolute: 0 10*3/uL (ref 0.0–0.1)
Basophils Relative: 1 %
Eosinophils Absolute: 0.1 10*3/uL (ref 0.0–0.5)
Eosinophils Relative: 2 %
HEMATOCRIT: 41.8 % (ref 36.0–46.0)
Hemoglobin: 13.2 g/dL (ref 12.0–15.0)
Immature Granulocytes: 0 %
LYMPHS ABS: 1.4 10*3/uL (ref 0.7–4.0)
Lymphocytes Relative: 24 %
MCH: 30.1 pg (ref 26.0–34.0)
MCHC: 31.6 g/dL (ref 30.0–36.0)
MCV: 95.2 fL (ref 80.0–100.0)
Monocytes Absolute: 0.5 10*3/uL (ref 0.1–1.0)
Monocytes Relative: 9 %
Neutro Abs: 3.7 10*3/uL (ref 1.7–7.7)
Neutrophils Relative %: 64 %
Platelets: 130 10*3/uL — ABNORMAL LOW (ref 150–400)
RBC: 4.39 MIL/uL (ref 3.87–5.11)
RDW: 13.4 % (ref 11.5–15.5)
WBC: 5.8 10*3/uL (ref 4.0–10.5)
nRBC: 0 % (ref 0.0–0.2)

## 2017-12-20 LAB — URINALYSIS, ROUTINE W REFLEX MICROSCOPIC
BACTERIA UA: NONE SEEN
Bilirubin Urine: NEGATIVE
Glucose, UA: NEGATIVE mg/dL
Hgb urine dipstick: NEGATIVE
Ketones, ur: 5 mg/dL — AB
Nitrite: NEGATIVE
Protein, ur: NEGATIVE mg/dL
Specific Gravity, Urine: 1.014 (ref 1.005–1.030)
pH: 6 (ref 5.0–8.0)

## 2017-12-20 LAB — LIPASE, BLOOD: Lipase: 28 U/L (ref 11–51)

## 2017-12-20 LAB — COMPREHENSIVE METABOLIC PANEL
ALBUMIN: 3.6 g/dL (ref 3.5–5.0)
ALT: 13 U/L (ref 0–44)
AST: 20 U/L (ref 15–41)
Alkaline Phosphatase: 49 U/L (ref 38–126)
Anion gap: 10 (ref 5–15)
BUN: 20 mg/dL (ref 8–23)
CO2: 28 mmol/L (ref 22–32)
Calcium: 9.4 mg/dL (ref 8.9–10.3)
Chloride: 104 mmol/L (ref 98–111)
Creatinine, Ser: 0.95 mg/dL (ref 0.44–1.00)
GFR calc Af Amer: >60 mL/min (ref >60)
GFR calc non Af Amer: 58 mL/min — ABNORMAL LOW (ref >60)
Glucose, Bld: 111 mg/dL — ABNORMAL HIGH (ref 70–99)
Potassium: 3.9 mmol/L (ref 3.5–5.1)
Sodium: 142 mmol/L (ref 135–145)
Total Bilirubin: 0.6 mg/dL (ref 0.3–1.2)
Total Protein: 6.1 g/dL — ABNORMAL LOW (ref 6.5–8.1)

## 2017-12-20 MED ORDER — METOCLOPRAMIDE HCL 5 MG/ML IJ SOLN
5.0000 mg | Freq: Once | INTRAMUSCULAR | Status: AC
  Administered 2017-12-20: 5 mg via INTRAVENOUS
  Filled 2017-12-20: qty 2

## 2017-12-20 MED ORDER — BARIUM SULFATE 2.1 % PO SUSP
ORAL | Status: AC
  Filled 2017-12-20: qty 2

## 2017-12-20 MED ORDER — FENTANYL CITRATE (PF) 100 MCG/2ML IJ SOLN
50.0000 ug | Freq: Once | INTRAMUSCULAR | Status: AC
Start: 2017-12-20 — End: 2017-12-20
  Administered 2017-12-20: 50 ug via INTRAVENOUS
  Filled 2017-12-20: qty 2

## 2017-12-20 MED ORDER — SODIUM CHLORIDE 0.9 % IV SOLN
INTRAVENOUS | Status: AC
  Administered 2017-12-20: 20:00:00 via INTRAVENOUS

## 2017-12-20 MED ORDER — HYDROCODONE-ACETAMINOPHEN 5-325 MG PO TABS
1.0000 | ORAL_TABLET | Freq: Four times a day (QID) | ORAL | Status: DC | PRN
  Administered 2017-12-21: 1 via ORAL
  Filled 2017-12-20: qty 1

## 2017-12-20 MED ORDER — ENOXAPARIN SODIUM 30 MG/0.3ML ~~LOC~~ SOLN: 30.0000 mg | SUBCUTANEOUS | Status: DC

## 2017-12-20 MED ORDER — KETOROLAC TROMETHAMINE 30 MG/ML IJ SOLN
30.0000 mg | Freq: Once | INTRAMUSCULAR | Status: AC
  Administered 2017-12-20: 30 mg via INTRAVENOUS
  Filled 2017-12-20: qty 1

## 2017-12-20 MED ORDER — ZINC OXIDE 12.8 % EX OINT
1.0000 "application " | TOPICAL_OINTMENT | Freq: Two times a day (BID) | CUTANEOUS | Status: DC | PRN
  Filled 2017-12-20: qty 56.7

## 2017-12-20 NOTE — H&P (Signed)
Date: 12/20/2017               Patient Name:  Jody [name] MRN: [id number]  DOB: 06-08-1940 Age / Sex: 77 y.o., female   PCP: Celedonio Savage, MD         Medical Service: Internal Medicine Teaching Service         Attending Physician: Dr. Bartholomew Crews, MD    First Contact: Dr. Nita Sickle MD Pager: [phone]  Second Contact: Dr. Vickki Muff MD Pager: [phone]       After Hours (After 5p/  First Contact Pager: [phone]  weekends / holidays): Second Contact Pager: [phone]   Chief Complaint: Lower abdominal pain  History of Present Illness: [name] is a 77 year old female with chronic cystitis, depression, gastric polyp, osteoporosis, pelvic mass seen on 10/2017 pelvic ultrasound, tobacco use who presents with lower abdominal pain.  [name] is a very tangential historian.   She states that over the last several days she has had increasing lower abdominal pain and has only been urinating once per day.  She states that this is been a chronic issue for her and has worsened recently.  She was using a Foley catheter which was removed 3 days ago.  Additionally she has been constipated over the last several days.  She states that her abdominal pain became unbearable today and she decided to come to the emergency department.    Per chart review she has a significant urologic history.  He has incomplete bladder emptying/retention.  She has had multiple abdominal surgeries (reports partial colectomy in 2012) as well as multiple work-ups for her urinary retention in multiple cities in [place].  She was doing clean intermittent catheterization until 3 months ago and stopped due to the "feeling of shards of glass which were too painful".  She did have a Foley catheter placed 1 and half weeks ago and she requested to have it removed due to pain and inconvenience.  Her urologist removed the catheter and recommended putting another one in but the patient declined.  She was seen by  urogynecology in [place] in early 2019 and had a cystoscopy that was normal with likely chronic cystitis.  She also was found to have a lobulated structure with mass-effect on the posterior inferior wall of the bladder on previous imaging in October 2019.  Per urology note she was to be scheduled for a CT abdomen and pelvis.  She was also referred to GYN oncology and a pelvic ultrasound was ordered.  She is followed by [facility] and was seen on 11/18/2017.  She was noted to have pelvic discomfort and chronic constipation on MiraLAX.  Previous colonoscopy in 2014 unremarkable.  She is scheduled for flexible sigmoidoscopy.  Meds:  Current Meds  Medication Sig  . Cholecalciferol (VITAMIN D) 50 MCG (2000 UT) tablet Take 2,000 Units by mouth daily.  . ciprofloxacin (CIPRO) 500 MG tablet Take 500 mg by mouth 2 (two) times daily. For 7 days started 12/2     Allergies: Allergies as of 12/20/2017 - Review Complete 12/20/2017  Allergen Reaction Noted  . Contrast media [iodinated diagnostic agents] Other (See Comments) 12/20/2017  . Ceclor [cefaclor] Hives 01/24/2011  . Codeine Other (See Comments) 01/24/2011  . Dimetapp [albertsons di bromm] Hives 01/24/2011  . Penicillins Hives and Nausea And Vomiting 01/24/2011  . Sulfa antibiotics Hives 01/24/2011   Past Medical History:  Diagnosis Date  . Back pain   . Constipation 12/2017  .  COPD (chronic obstructive pulmonary disease) ([place])   . Heart murmur   . Osteoarthrosis     Family History: No significant family history.  Social History: Current every day smoker half a pack per day.  Denies alcohol and drug use.  Review of Systems: A complete ROS was negative except as per HPI.   Physical Exam: Blood pressure 119/67, pulse 95, temperature 98.2 F (36.8 C), temperature source Oral, resp. rate 16, height 5\' 7"  (1.702 m), weight 36.3 kg, SpO2 97 %. Physical Exam  Constitutional:  Thin, frail-appearing female lying in bed in no acute distress.    HENT:  Head: Normocephalic and atraumatic.  Eyes: Conjunctivae and EOM are normal.  Neck: Normal range of motion.  Cardiovascular: Normal rate, regular rhythm and normal heart sounds.  Pulmonary/Chest: Effort normal and breath sounds normal. No respiratory distress.  Abdominal: She exhibits no distension.  Soft.  Tenderness to palpation of the suprapubic region.  Increased flatus.  Increased bowel sounds.  Musculoskeletal: She exhibits no edema or tenderness.  Neurological: She is alert.  Skin: Skin is warm and dry.  Psychiatric: She has a normal mood and affect. Her behavior is normal.  Nursing note and vitals reviewed.   CT Abdomen/Pelvis: IMPRESSION: 1. Rectum is massively distended with oral contrast and a large amount of stool. Findings are suggestive for constipation and possibly rectal impaction. 2. Concern for wall thickening involving the GE junction and proximal stomach. Findings are poorly characterized but could be better evaluated with endoscopy and/or post IV contrast examination. 3. Subtotal colectomy. Oral contrast throughout the small bowel and within the rectum. Small bowel is mildly distended but probably related to the large stool burden in the rectum. 4. Moderate urinary bladder distension compatible with history of urinary retention. Mild fullness in the renal collecting systems without significant hydronephrosis. 5.  Aortic Atherosclerosis  Labs: Remarkable CBC and CMP.  Lipase within normal limits.  Urinalysis with small leukocytes but otherwise unremarkable.  Assessment & Plan by Problem: Active Problems:   Urinary retention  [name] presented with increasing abdominal pain and found to have urinary retention (in and out cath with 400 mils output) and found to have a large stool burden on CT.  Her abdominal pain is likely secondary to a combination of her chronic neurogenic bladder, urinary retention, and constipation.  Chronic neurogenic bladder: 1.  In  and out cath every 8 hours PRN 2.  Bladder scan as needed 3.  Norco 5-325 mg every 6 hours PRN pain  Chronic constipation: s/p Reglan and oral contrast with good stool output. 1.  IV continuous fluids for volume repletion.  Pelvic Mass: Seen on previous imaging 1. Ordered pelvic complete ultrasound with transvaginal 2. She will likely need to follow-up with GYN/ONC as an outpatient; previously referred by urologist.  Dispo: Admit patient to Observation with expected length of stay less than 2 midnights.  Signed: Carroll Sage, MD 12/20/2017, 5:56 PM  Pager: [phone]

## 2017-12-20 NOTE — ED Triage Notes (Signed)
Pt presents with R side abdominal pain past 5-6 days, worsens with movement. Hx of urinary retention, catheter placed and removed d/t infection, reports being able to void since.

## 2017-12-20 NOTE — ED Notes (Signed)
In and out Cath with 400 mL output, culture also sent down.

## 2017-12-20 NOTE — ED Provider Notes (Signed)
[facility] EMERGENCY DEPARTMENT Provider Note   CSN: [id number] Arrival date & time: 12/20/17  8366     History   Chief Complaint No chief complaint on file.  Patient is a vague historian HPI Jody [name] is a 77 y.o. female.  HPI Complains of lower abdominal pain, bilateral over lower quadrants nonradiating onset 5 days ago, constant.  Pain is made worse by movement improved with remaining still.  Denies any fever.  Last bowel movement yesterday.  No urinary symptoms.  Treated with ibuprofen, without relief she is never had pain like this before.  She is known to have a lobulated structure with mass-effect on the posterior wall of the bladder, may be uterine in nature, from report reviewed from office visit today [facility] 12/17/2017 and she is known to have urinary retention and chronic cystitis.  She reports that this pain is different.  She does feel like she empties her bladder presently.  He does admit to diminished appetite. Past Medical History:  Diagnosis Date  . Back pain   . COPD (chronic obstructive pulmonary disease) ([place])   History of urinary  retention chronic cystitis.  Foley catheter recently removed at patient's request  Patient Active Problem List   Diagnosis Date Noted  . Back pain 01/24/2011  . COPD (chronic obstructive pulmonary disease) ([place]) 01/24/2011    Past Surgical History:  Procedure Laterality Date  . EPIDURAL BLOCK INJECTION     a few ago in [place]  . PARTIAL COLECTOMY     for constipation     OB History   None      Home Medications    Prior to Admission medications   Medication Sig Start Date End Date Taking? Authorizing Provider  gabapentin (NEURONTIN) 300 MG capsule Take 300 mg by mouth 2 (two) times daily.    [provider]  LORazepam (ATIVAN) 1 MG tablet Take 1 mg by mouth Nightly.    [provider]  nitroGLYCERIN (NITRODUR - DOSED IN MG/24 HR) 0.4 mg/hr patch Place 0.4 mg  onto the skin daily.    [provider]  omeprazole (PRILOSEC) 20 MG capsule Take 1 capsule (20 mg total) by mouth 2 (two) times daily. 01/24/11 01/24/12  Setzer, Rona Ravens, NP  promethazine (PHENERGAN) 25 MG tablet Take 25 mg by mouth every 6 (six) hours as needed for nausea or vomiting.    [provider]  tamsulosin (FLOMAX) 0.4 MG CAPS capsule Take 0.4 mg by mouth.    [provider]    Family History History reviewed. No pertinent family history.  Social History Social History   Tobacco Use  . Smoking status: Current Every Day Smoker  . Smokeless tobacco: Never Used  . Tobacco comment: 1/2 pack a day since age 72.  Substance Use Topics  . Alcohol use: No  . Drug use: No     Allergies   Contrast media [iodinated diagnostic agents]; Ceclor [cefaclor]; Codeine; Dimetapp [albertsons di bromm]; Morphine and related; Penicillins; and Sulfa antibiotics   Review of Systems Review of Systems  Constitutional: Positive for appetite change.  Gastrointestinal: Positive for abdominal pain and nausea.  Musculoskeletal: Positive for back pain.       Chronic back pain  All other systems reviewed and are negative.    Physical Exam Updated Vital Signs Ht 5\' 7"  (1.702 m)   Wt 36.3 kg   BMI 12.53 kg/m   Physical Exam  Constitutional: She appears distressed.  Frail-appearing appears  uncomfortable  HENT:  Head: Normocephalic and atraumatic.  Eyes: Pupils are equal, round, and reactive to light. Conjunctivae are normal.  Neck: Neck supple. No tracheal deviation present. No thyromegaly present.  Cardiovascular: Normal rate and regular rhythm.  No murmur heard. Pulmonary/Chest: Effort normal and breath sounds normal.  Abdominal: Soft. Bowel sounds are normal. She exhibits no distension. There is tenderness.  Midline surgical scar.  Tender over bilateral lower quadrants and infraumbilical area  Musculoskeletal: Normal range of motion. She exhibits no edema or  tenderness.  Neurological: She is alert. Coordination normal.  Skin: Skin is warm and dry. No rash noted.  Psychiatric: She has a normal mood and affect.  Nursing note and vitals reviewed.    ED Treatments / Results  Labs (all labs ordered are listed, but only abnormal results are displayed) Labs Reviewed  URINALYSIS, ROUTINE W REFLEX MICROSCOPIC  CBC WITH DIFFERENTIAL/PLATELET  COMPREHENSIVE METABOLIC PANEL  LIPASE, BLOOD   Results for orders placed or performed during the hospital encounter of 12/20/17  Urinalysis, Routine w reflex microscopic  Result Value Ref Range   Color, Urine YELLOW YELLOW   APPearance HAZY (A) CLEAR   Specific Gravity, Urine 1.014 1.005 - 1.030   pH 6.0 5.0 - 8.0   Glucose, UA NEGATIVE NEGATIVE mg/dL   Hgb urine dipstick NEGATIVE NEGATIVE   Bilirubin Urine NEGATIVE NEGATIVE   Ketones, ur 5 (A) NEGATIVE mg/dL   Protein, ur NEGATIVE NEGATIVE mg/dL   Nitrite NEGATIVE NEGATIVE   Leukocytes, UA SMALL (A) NEGATIVE   RBC / HPF 0-5 0 - 5 RBC/hpf   WBC, UA 0-5 0 - 5 WBC/hpf   Bacteria, UA NONE SEEN NONE SEEN   Squamous Epithelial / LPF 0-5 0 - 5  Comprehensive metabolic panel  Result Value Ref Range   Sodium 142 135 - 145 mmol/L   Potassium 3.9 3.5 - 5.1 mmol/L   Chloride 104 98 - 111 mmol/L   CO2 28 22 - 32 mmol/L   Glucose, Bld 111 (H) 70 - 99 mg/dL   BUN 20 8 - 23 mg/dL   Creatinine, Ser 0.95 0.44 - 1.00 mg/dL   Calcium 9.4 8.9 - 10.3 mg/dL   Total Protein 6.1 (L) 6.5 - 8.1 g/dL   Albumin 3.6 3.5 - 5.0 g/dL   AST 20 15 - 41 U/L   ALT 13 0 - 44 U/L   Alkaline Phosphatase 49 38 - 126 U/L   Total Bilirubin 0.6 0.3 - 1.2 mg/dL   GFR calc non Af Amer 58 (L) >60 mL/min   GFR calc Af Amer >60 >60 mL/min   Anion gap 10 5 - 15  Lipase, blood  Result Value Ref Range   Lipase 28 11 - 51 U/L  CBC with Differential/Platelet  Result Value Ref Range   WBC 5.8 4.0 - 10.5 K/uL   RBC 4.39 3.87 - 5.11 MIL/uL   Hemoglobin 13.2 12.0 - 15.0 g/dL   HCT 41.8  36.0 - 46.0 %   MCV 95.2 80.0 - 100.0 fL   MCH 30.1 26.0 - 34.0 pg   MCHC 31.6 30.0 - 36.0 g/dL   RDW 13.4 11.5 - 15.5 %   Platelets 130 (L) 150 - 400 K/uL   nRBC 0.0 0.0 - 0.2 %   Neutrophils Relative % 64 %   Neutro Abs 3.7 1.7 - 7.7 K/uL   Lymphocytes Relative 24 %   Lymphs Abs 1.4 0.7 - 4.0 K/uL   Monocytes Relative 9 %  Monocytes Absolute 0.5 0.1 - 1.0 K/uL   Eosinophils Relative 2 %   Eosinophils Absolute 0.1 0.0 - 0.5 K/uL   Basophils Relative 1 %   Basophils Absolute 0.0 0.0 - 0.1 K/uL   Immature Granulocytes 0 %   Abs Immature Granulocytes 0.01 0.00 - 0.07 K/uL   Ct Abdomen Pelvis Wo Contrast  Result Date: 12/20/2017 CLINICAL DATA:  77 year old with right abdominal pain. Diverticulitis suspected. History of partial colectomy. EXAM: CT ABDOMEN AND PELVIS WITHOUT CONTRAST TECHNIQUE: Multidetector CT imaging of the abdomen and pelvis was performed following the standard protocol without IV contrast. COMPARISON:  Small bowel series 05/13/2013 and renal ultrasound 03/15/2015 FINDINGS: Lower chest: Few linear densities at lung bases are suggestive for atelectasis or mild scarring. No pleural effusions. Hepatobiliary: Subcentimeter hypodensities in the left hepatic lobe are nonspecific but favor incidental findings such as a cysts. No suspicious liver lesions. The gallbladder is unremarkable. Pancreas: Limited evaluation on this noncontrast examination but no gross abnormality. Spleen: Normal in size without focal abnormality. Adrenals/Urinary Tract: Low-density nodularity in the left adrenal gland measures 1.5 cm and Hounsfield units are suggestive for a benign adenoma. Right adrenal gland is poorly visualized. Negative for kidney stones. Mild fullness in the renal collecting systems bilaterally without significant hydronephrosis. Moderate distention of the urinary bladder. Small amount of high-density material along the left posterior aspect of the urinary bladder is nonspecific.  Stomach/Bowel: Rectum is massively distended with stool and oral contrast. Surgical anastomosis in the region of the rectosigmoid junction and compatible with an ileocolic anastomosis. Findings are suggestive for a subtotal colectomy because normal colon is not seen along the paracolic gutters. Small bowel loops are mildly distended containing oral contrast. No evidence for a bowel obstruction. However, there is concern for wall thickening involving the GE junction and the gastric cardia region. This presumed gastric wall thickening is seen on sequence 3, image 9. Vascular/Lymphatic: Atherosclerotic calcifications in the aorta without aneurysm. Atherosclerotic calcifications in the iliac arteries. No significant lymph node enlargement in the abdomen pelvis but limited evaluation due to the lack of intra-abdominal fat and lack of intravenous contrast. Reproductive: There appears to be a uterus that is displaced towards the left side of the upper pelvis. No evidence for an adnexal mass. Other: Negative for free fluid.  Negative for free air. Musculoskeletal: Old compression deformity involving the L3 vertebral body containing bone cement. Vertebral body height loss at L5 appears old. Question subtle compression deformity along the superior endplate of L1 but this could also be related to degenerative changes and a Schmorl's node. Diffuse osteopenia in the bones. IMPRESSION: 1. Rectum is massively distended with oral contrast and a large amount of stool. Findings are suggestive for constipation and possibly rectal impaction. 2. Concern for wall thickening involving the GE junction and proximal stomach. Findings are poorly characterized but could be better evaluated with endoscopy and/or post IV contrast examination. 3. Subtotal colectomy. Oral contrast throughout the small bowel and within the rectum. Small bowel is mildly distended but probably related to the large stool burden in the rectum. 4. Moderate urinary  bladder distension compatible with history of urinary retention. Mild fullness in the renal collecting systems without significant hydronephrosis. 5.  Aortic Atherosclerosis (ICD10-I70.0). Electronically Signed   By: Markus Daft M.D.   On: 12/20/2017 12:28   EKG None  Radiology No results found.  Procedures Procedures (including critical care time)  Medications Ordered in ED Medications  fentaNYL (SUBLIMAZE) injection 50 mcg (has no administration in  time range)  metoCLOPramide (REGLAN) injection 5 mg (has no administration in time range)     Initial Impression / Assessment and Plan / ED Course  I have reviewed the triage vital signs and the nursing notes.  Pertinent labs & imaging results that were available during my care of the patient were reviewed by me and considered in my medical decision making (see chart for details).     After return from radiology suite patient had several episodes of diarrhea.  I performed digital rectal exam and attempted to disimpact the patient.  There was only liquid brown stool in the rectal vault.  I suspect the disimpaction has been alleviated.  At 2:20 PM she continues to complain of pain in her lower abdomen and back with movement.   In shared decision making I offered patient overnight hospitalization versus going home with pain medicine.  She prefers overnight hospitalization.  I have consulted Dr. Evie Lacks from internal medicine service who will evaluate patient for overnight stay Lab work unremarkable Final Clinical Impressions(s) / ED Diagnoses  Dx low abdominal pain Final diagnoses:  None    ED Discharge Orders    None       Orlie Dakin, MD 12/20/17 [phone]

## 2017-12-21 DIAGNOSIS — N319 Neuromuscular dysfunction of bladder, unspecified: Secondary | ICD-10-CM | POA: Diagnosis not present

## 2017-12-21 DIAGNOSIS — Z885 Allergy status to narcotic agent status: Secondary | ICD-10-CM

## 2017-12-21 DIAGNOSIS — K5909 Other constipation: Secondary | ICD-10-CM | POA: Diagnosis not present

## 2017-12-21 DIAGNOSIS — Z880 Allergy status to penicillin: Secondary | ICD-10-CM

## 2017-12-21 DIAGNOSIS — Z888 Allergy status to other drugs, medicaments and biological substances status: Secondary | ICD-10-CM

## 2017-12-21 DIAGNOSIS — Z91041 Radiographic dye allergy status: Secondary | ICD-10-CM

## 2017-12-21 DIAGNOSIS — R339 Retention of urine, unspecified: Secondary | ICD-10-CM | POA: Diagnosis not present

## 2017-12-21 DIAGNOSIS — Z881 Allergy status to other antibiotic agents status: Secondary | ICD-10-CM

## 2017-12-21 DIAGNOSIS — I959 Hypotension, unspecified: Secondary | ICD-10-CM | POA: Diagnosis not present

## 2017-12-21 DIAGNOSIS — M810 Age-related osteoporosis without current pathological fracture: Secondary | ICD-10-CM

## 2017-12-21 LAB — BASIC METABOLIC PANEL
Anion gap: 7 (ref 5–15)
BUN: 18 mg/dL (ref 8–23)
CALCIUM: 8.7 mg/dL — AB (ref 8.9–10.3)
CO2: 27 mmol/L (ref 22–32)
CREATININE: 0.77 mg/dL (ref 0.44–1.00)
Chloride: 107 mmol/L (ref 98–111)
GFR calc Af Amer: >60 mL/min (ref >60)
Glucose, Bld: 101 mg/dL — ABNORMAL HIGH (ref 70–99)
Potassium: 3.8 mmol/L (ref 3.5–5.1)
Sodium: 141 mmol/L (ref 135–145)

## 2017-12-21 LAB — CBC
HCT: 37.5 % (ref 36.0–46.0)
Hemoglobin: 12 g/dL (ref 12.0–15.0)
MCH: 29.9 pg (ref 26.0–34.0)
MCHC: 32 g/dL (ref 30.0–36.0)
MCV: 93.3 fL (ref 80.0–100.0)
Platelets: 127 10*3/uL — ABNORMAL LOW (ref 150–400)
RBC: 4.02 MIL/uL (ref 3.87–5.11)
RDW: 13.2 % (ref 11.5–15.5)
WBC: 4 10*3/uL (ref 4.0–10.5)
nRBC: 0 % (ref 0.0–0.2)

## 2017-12-21 MED ORDER — POLYETHYLENE GLYCOL 3350 17 G PO PACK: 17.0000 g | PACK | Freq: Every day | ORAL | 0 refills | Status: DC

## 2017-12-21 MED ORDER — BISACODYL 5 MG PO TBEC: 5.0000 mg | DELAYED_RELEASE_TABLET | Freq: Every day | ORAL | 0 refills | Status: AC | PRN

## 2017-12-21 NOTE — Progress Notes (Signed)
Date: 12/21/2017  Patient name: Jody [name]  Medical record number: [id number]  Date of birth: May 16, 1940   I have seen and evaluated Jody [name] and discussed their care with the Residency Team. Jody [name] is a 77 year old female with known neurogenic bladder requiring intermittent self catheterization or a Foley catheter.  Her Foley catheter was removed 3 days ago per patient's request against advice from her urologist.  She also has known chronic constipation and is under the care of a gastroenterologist and has a flex sig scheduled in the near future.  Since her Foley catheter was removed she has had increasing lower abdominal pain and decreased urination.  She came to the ED and had an I / O cath with 400 cc of urine.  She additionally had diarrhea following the oral contrast for her CT scan and an attempted disimpaction which was unsuccessful as there was no stool in the vault.  Since she has had the IO cath and resolution of her constipation, her pain has resolved.  She had been told by her urologist that she had a mass in her pelvis.  An ultrasound done recently showed a lobulated structure with mass-effect on the posterior wall of the bladder.  The mass was felt to be uterine in nature.  She then underwent a CT of the abdomen and pelvis which showed thickening along the posterior inferior portion of the bladder which appears to be extrinsic and may be vaginal or uterine in origin.  However the CT scan was non-contrasted and therefore limited.  She had a CT scan on admission and a transabdominal ultrasound and neither showed any pelvic mass.  This morning, she has generalized frustration at the conflicting information she is receiving from her [place] physicians [place].  She refuses a Foley catheter but is willing to have home health to help educate and instruct for intermittent catheterization.   Vitals:   12/21/17 0850 12/21/17 0925  BP: (!) 75/32 112/62  Pulse: 64     Resp: 18   Temp: 98.4 F (36.9 C)   SpO2: 92%   Gen : Chronically ill-appearing thin woman but in no acute distress HRRR no MRG  Platelets 127 UA 0-5 squamous epi cells, small leukocyte, negative nitrite, 0-5 white blood cells, 0-5 RBCs  CT of the abdomen and pelvis -Massively distended rectum with large amount of stool -Possible wall thickening of the GE junction and proximal stomach.  Endoscopy ER IV contrast was suggested for further evaluation. -Status post subtotal colectomy -Moderate urinary bladder distention with mild fullness in the renal collecting systems -Aortic atherosclerosis -Old compression deformity of L3 containing bone cement.  L5 vertebral body height loss.  Possible compression deformity of L1.  Diffuse osteopenia  Assessment and Plan: I have seen and evaluated the patient as outlined above. I agree with the formulated Assessment and Plan as detailed in the residents' note, with the following changes: Jody [name] is a 77 year old female with acute on chronic symptomatic urinary distention secondary to chronic neurogenic bladder and acute on chronic constipation.  She has a slew of outpatient subspecialist.  With I/O cath and return of 400 cc of urine and resolution of her constipation, her abdominal pain resolved.  She has her outpatient subspecialist to work-up etiologies and a long-term treatment regimen.  Our goal is to keep her stable until she sees her outpatient doctors.  She refuses a Foley catheter so we have stressed I/O cath necessity.  The reason that she got  the Foley catheter is that she refused further I/O catheterization.  We will get home health to ease the transition back into I/O catheterization and education.  We are also going to continue her MiraLAX and stressed to her that she take needs to take it daily and will also start a stimulant laxative to prevent further worsening of her constipation.  1.  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  We have stressed the importance of I/O cath and arranging for home health.  2.  Acute on chronic constipation - resolved.  We will continue her MiraLAX and add a stimulant laxative.  She already has GI follow-up.  3.  History of pelvic mass - our CT and ultrasound did not show any pelvic mass.  We will make certain her outpatient urologist, who had referred her to gynecology, has our results to discuss whether that appointment is still needed.  4.  Osteoporosis - we are not her PCP and she already has a slew of physicians and is already frustrated at conflicting information.  Therefore we will defer work-up and management to her outpatient team.  5. Low blood pressure -I reviewed several of her recent outpatient appointments and her systolic blood pressure is usually in the 90s to low 100's.  Therefore, her systolic blood pressure of 75 is not that far off from her baseline and is probably a little bit lower than her baseline as she is hospitalized and not smoking.  She appears well and has no complaints and her low blood pressure needs no further work-up.  She is stable to be discharged home  Bartholomew Crews, MD 12/7/20199:47 AM

## 2017-12-21 NOTE — Progress Notes (Signed)
   Subjective: She reports complete resolution of her abdominal pain. She states that she had an in-and-out cath this morning. She is overall feeling better. We explained to her that she does not have a pelvic mass and that she will need to follow-up with her urologist ASAP. She expressed understanding and agreement.  Objective:  Vital signs in last 24 hours: Vitals:   12/20/17 1954 12/21/17 0408 12/21/17 0850 12/21/17 0925  BP: (!) 156/81 98/61 (!) 75/32 112/62  Pulse: 85 71 64   Resp: (!) 22 16 18    Temp: 98.4 F (36.9 C) 98.3 F (36.8 C) 98.4 F (36.9 C)   TempSrc: Oral Oral Oral   SpO2: 95% 93% 92%   Weight:      Height:       Physical Exam  Constitutional:  Thin frail appearing female lying in bed in no acute distress.  Abdominal: Soft. Bowel sounds are normal. She exhibits no distension. There is no tenderness.  Neurological: She is alert.  Psychiatric: She has a normal mood and affect. Her behavior is normal.    Assessment/Plan:  Active Problems:   Urinary retention   Ms. Vitug presented with increasing abdominal pain and found to have urinary retention and found a large stool burden on CT.  She was very concerned about an "pelvic mass" see on previous imaging. Both a CT abd/pelvis and transabdominal US did not show signs of a mass; uterus appears normal. Her abdominal pain has resolved this morning with in/out catheretization and treatment of constipation. She does not want a foley but is in agreement for as-needed in/out caths. I will place a home-health order for help with this. Additionally she will be taught by nursing staff how to do it on her own. She is medically stable and will be discharged. She will need to follow-up with her Urologist at [facility]. She expressed understanding and agreement.   Chronic neurogenic bladder: 1. Order HH for In and out caths PRN 2. Nursing to teach Ms. Fanelli how to do an in and out cath herself  Chronic constipation: s/p Reglan  and oral contrast with good stool output. 1.  She will need to continue Miraalax and Ducolax at home.  Dispo: Anticipated discharge today  Carroll Sage, MD 12/21/2017, 10:09 AM Pager: [phone]

## 2017-12-21 NOTE — Progress Notes (Signed)
Patient discharged to home with daughter. After visit Summary reviewed. Patient capable of reverbalizing medications and follow up visits. No signs and symptoms of distress noted. Patient educated to return to the ED in the case of an emergency. Montae Stager RN 

## 2017-12-21 NOTE — Discharge Summary (Signed)
Name: Jody [name] MRN: [id number] DOB: 09-07-1940 77 y.o. PCP: Celedonio Savage, MD  Date of Admission: 12/20/2017  8:07 AM Date of Discharge: 12/21/2017 Attending Physician: Bartholomew Crews, MD  Discharge Diagnosis: 1. Chronic neurogenic bladder 2. Chronic constipation  Discharge Medications: Allergies as of 12/21/2017      Reactions   Contrast Media [iodinated Diagnostic Agents] Other (See Comments)   Pain in her chest   Ceclor [cefaclor] Hives   Codeine Other (See Comments)   whelps   Dimetapp [albertsons Di Bromm] Hives   Penicillins Hives, Nausea And Vomiting   Sulfa Antibiotics Hives      Medication List    STOP taking these medications   ciprofloxacin 500 MG tablet Commonly known as:  CIPRO     TAKE these medications   bisacodyl 5 MG EC tablet Commonly known as:  DULCOLAX Take 1 tablet (5 mg total) by mouth daily as needed for moderate constipation.   omeprazole 20 MG capsule Commonly known as:  PRILOSEC Take 1 capsule (20 mg total) by mouth 2 (two) times daily.   polyethylene glycol packet Commonly known as:  MIRALAX / GLYCOLAX Take 17 g by mouth daily.   Vitamin D 50 MCG (2000 UT) tablet Take 2,000 Units by mouth daily.       Disposition and follow-up:   Jody [name] was discharged from [facility] in Stable condition.  At the hospital follow up visit please address:  1.  Urinary retention and use of in and out caths.   2. Constipation: Ensure that she is taking both Miralax and Docusate.   3.  Labs / imaging needed at time of follow-up: None  4.  Pending labs/ test needing follow-up: None  Follow-up Appointments: Follow-up Information    Anderson-Riddell, Fraser Din, NP. Schedule an appointment as soon as possible for a visit.   Specialty:  Family Medicine Contact information: [place] [address] [phone]        Celedonio Savage, MD. Schedule an appointment as soon as possible for a  visit.   Specialty:  Family Medicine Contact information: [place] [address] [phone]           Hospital Course by problem list: 1. Chronic neurogenic bladder: Per chart review, Jody [name] was doing her own clean intermittent catheratization for chronic neurogenic bladded until 3 months ago when she stopped due to discomfort. 1 week ago she did have a foley placed by her urologist but requested to have it removed against medical device. It was around this time that she began to have suprapubic abdominal pain. She subsequently presented to the ED several days later with increasing abdominal pain and found to have urinary retention. She underwent multiple in and out caths with significant output throughout her admission. She was taught how to do her own in and out catheretization by nursing and a home health order was placed for help with catheretization at home.  Of note, Jody [name] was found to have a lobulated structure with mass-effect on the posterior inferior wall of the bladder on previous imaging in October 2019. A repeat pelvic ultrasound as well as abdominal CT did not show any mass.  2. Chronic constipation: Abdominal CT was significant for a large stool burden. Per chart review, she is managed by a [facility] doctor who has recommended Miralax. Ms. Kidney states that she has had very little alleviation of her constipation with Miralax  and has thus not been taking it on a regular basis. In the ED, she was given Reglan as well as oral contrast (w/ CT) which provided good stool output. Additionally, she was given IV fluid for volume repletion. Prior to discharge, her abdominal pain had completely resolved. She was prescribed Miralax and Docusate for her constipation.  Discharge Vitals:   BP 112/62 (BP Location: Right Arm)   Pulse 64   Temp 98.4 F (36.9 C) (Oral)   Resp 18   Ht 5\' 7"  (1.702 m)   Wt 36.3 kg   SpO2 92%   BMI 12.53 kg/m   Pertinent Labs, Studies,  and Procedures:  CT Abd/Pelvis: IMPRESSION: 1. Rectum is massively distended with oral contrast and a large amount of stool. Findings are suggestive for constipation and possibly rectal impaction. 2. Concern for wall thickening involving the GE junction and proximal stomach. Findings are poorly characterized but could be better evaluated with endoscopy and/or post IV contrast examination. 3. Subtotal colectomy. Oral contrast throughout the small bowel and within the rectum. Small bowel is mildly distended but probably related to the large stool burden in the rectum. 4. Moderate urinary bladder distension compatible with history of urinary retention. Mild fullness in the renal collecting systems without significant hydronephrosis. 5.  Aortic Atherosclerosis   Pelvic US: IMPRESSION: Normal appearing uterus. Neither ovary could be identified using transabdominal technique. The patient would not tolerate transvaginal scanning. No free fluid. Large amount of fecal matter in the neo rectum.  Discharge Instructions: Discharge Instructions    Diet - low sodium heart healthy   Complete by:  As directed    Discharge instructions   Complete by:  As directed    Thank you for allowing [place] to take care of you.  We do think that your belly pain was due to retaining too much urine and having a lot of stool in your belly.  I am glad that your pain has now gone away.  We did not find a mass on any of your imagings.  We will make sure that you are urologist has all of these imaging studies to look over.  It is important for you to make an appointment with your urologist within the next week.  Also sent in an order for home health to do in and out catheterizations.  It will still be important for you to do them yourself whenever needed.   Face-to-face encounter (required for Medicare/Medicaid patients)   Complete by:  As directed    I Jody [name] certify that this patient is under my care and that  I, or a nurse practitioner or physician's assistant working with me, had a face-to-face encounter that meets the physician face-to-face encounter requirements with this patient on 12/21/2017. The encounter with the patient was in whole, or in part for the following medical condition(s) which is the primary reason for home health care (List medical condition): Urinary retention (needs intermittent in/out catheretization), chronic constipation, degenerative disc disease, wheelchair bound/   The encounter with the patient was in whole, or in part, for the following medical condition, which is the primary reason for home health care:  Urinary retention, chronic constipation, degenerative disc disease, wheelchair bound   I certify that, based on my findings, the following services are medically necessary home health services:  Nursing   Reason for Medically [place]:  Other See Comments   My clinical findings support the need for the above services:   Bedbound  Unable to leave home safely without assistance and/or assistive device     Further, I certify that my clinical findings support that this patient is homebound due to:  Can transfer bed to chair only   Home Health   Complete by:  As directed    She will need help with in/out catheratizations as well as help with ambulation.   To provide the following care/treatments:  [place]   She is requesting [facility] in [place], [place].   Increase activity slowly   Complete by:  As directed       Signed: Carroll Sage, MD 12/21/2017, 10:36 AM   Pager: [phone]

## 2017-12-21 NOTE — Progress Notes (Signed)
Patient declined [place] services through any other company than [employer] [phone]). Stevie Kern provides PCS through [place]. Patient has medicaid, discussed w family how to apply for PCS through PCP or DSS in [place]. No other CM needs.

## 2018-10-06 NOTE — Telephone Encounter (Signed)
Called Dr. Samuella Cota in [place] and asked to have records faxed over. They stated pt needs to sign record release form and fax to them. I called pt to ask if she could come by the office to sign release form to have records faxed to [place] because Dr. Cleda Mccreedy wont be able to see her without them. She stated she could not come by the office but we could mail her the form and she would sign and mail it back to [place]. I have mailed her a record release form already filled out, all she has to do is sign, on 10/06/18.

## 2018-10-06 NOTE — Telephone Encounter (Signed)
Formatting of this note might be different from the original.  Called Dr. March Rummage in [place] and asked to have records faxed over. They stated pt needs to sign record release form and fax to them. I called pt to ask if she could come by the office to sign release form to have records faxed to [place] because Dr. Reinaldo Raddle wont be able to see her without them. She stated she could not come by the office but we could mail her the form and she would sign and mail it back to [place]. I have mailed her a record release form already filled out, all she has to do is sign, on 10/06/18.   Electronically signed by Kathlyn Sacramento at 10/06/2018 10:42 AM EDT

## 2018-10-21 ENCOUNTER — Encounter: Payer: MEDICARE | Attending: Urology

## 2019-06-01 NOTE — Progress Notes (Signed)
Formatting of this note might be different from the original.  Gave avs at visit ,crg, cma  Electronically signed by Corwin Levins, CMA at 06/01/2019 12:06 PM EDT

## 2019-06-01 NOTE — Progress Notes (Signed)
Formatting of this note is different from the original.  Images from the original note were not included.    [address], [address]  [place] [address]  [id number]    Follow-up    Subjective     Patient ID:  Kimberly Sherman is a 79 y.o. (DOB 1940/05/17) female.     CC:     Patient presents with   ? Constipation       HPI: 79 year old female is coming today for follow-up of chronic constipation and dysphagia.  She has been seen by numerous GI physicians prior to her visit with me.  She had undergone an endoscopic ultrasound with Dr. 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  On initial presentation with me she had continued to complain of a "esophageal mass".  She has had longstanding issues with dysphagia and constipation.  She underwent an esophagram in June 2020 at which time she was found to have poor primary and secondary peristalsis in the mid and distal esophagus.  This was suggestive of a nonspecific dysmotility.  She was unable to swallow a 13 mm barium tablet.  She came back for follow-up in October 2020 and continued to complain of difficulty swallowing.  She also complained of nausea.  I spoke with her about obtaining an esophageal manometry study however she did not feel that she could tolerate this.  She had reported that her dysphagia was mostly to solids.  She had undergone dilatations in the past without much benefit.  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  Safe swallowing techniques were reviewed and she was continued on high-dose PPIs.  Since her last visit she has called the office multiple times reporting that her dysphagia had developed after her esophagram although the study was ordered to further evaluate her longstanding history of  dysphagia and her presenting symptom.    She has also had issues with chronic constipation and has been tried on multiple medication regimens including MiraLAX, Maalox and Linzess.  At the time of her last visit she was started on Amitiza 8 mcg twice daily as she reported that her stools were too loose with Linzess.  She is coming today for follow-up.  The patient reports that she stopped the Linzess and Amitiza due to loose stools.  She continues to have constipation currently and has not had a bowel movement in 3 days.  She reports that she had seen a Dr. Evelina Bucy in [facility] who had a CT scan performed and is unsure of the results.  She denies nausea or vomiting.  She reports ongoing symptoms of dysphagia.  She reports that she has a hoarseness to her voice which she believes is secondary to her esophageal cyst.  The patient continues to have constipation and I have asked her to restart her Linzess. She continues to see OB/GYN for her uterine prolapse.    Review of Systems:   Except as stated in the HPI, all other systems reviewed and are negative.     Past Medical History:   Diagnosis Date   ? Arthritis    ? COPD (chronic obstructive pulmonary disease) (*)    ? GERD (gastroesophageal reflux disease)    ? History of transfusion    ?  Murmur    ? Shingles     both eyes     Medications:  Outpatient Medications Marked as Taking for the 06/01/19 encounter (Office Visit) with Adolphus Birchwood, MD   Medication Sig Dispense Refill   ? Cholecalciferol (VITAMIN D) 50 MCG (2000 UT) tablet Take 2,000 Units by mouth daily.     ? ferrous sulfate (FERROUS SULFATE) 325 (65 FE) MG tablet      ? gabapentin (NEURONTIN) 100 mg capsule      ? lidocaine (XYLOCAINE) 5 % ointment Apply topically as needed. 35.44 g 1   ? ondansetron (ZOFRAN) 4 mg tablet          Allergies   Allergen Reactions   ? Contrast [Iodinated Diagnostic Agents] Chest Pain   ? Barium-Containing Compounds Other     Patient claims made her throat peel   ?  Cefaclor Hives   ? Ciprofloxacin Hives   ? Codeine Hives   ? Dimetapp Cold-Allergy Hives   ? Morphine And Related Hives   ? Penicillins Hives     Sick    ? Sulfa Antibiotics Hives       Objective     BP 96/60 (BP Location: Left arm, Patient Position: Sitting)   Temp 98.5 F (36.9 C) (Oral)   Ht 5\' 7"  (1.702 m)   Wt 92 lb (41.7 kg)   BMI 14.41 kg/m      General Appearance:   Cachectic female, in wheelchair   Head:  Normocephalic, without obvious abnormality, atraumatic   Eyes:  PERRL, conjunctiva/corneas clear   Nose: Nares normal, mucosa normal   Throat: Lips, mucosa, and tongue normal; teeth and gums normal   Neck: Supple, symmetrical, trachea midline, no adenopathy; thyroid without tenderness/mass/nodules; no JVD   Lungs:   Clear to auscultation bilaterally, respirations unlabored, excursion symmetrical   Breasts:  Deferred   Heart:  Regular rate and rhythm, S1 and S2 normal, no murmur, rub, or gallop   Abdomen:    lower quadrant protrusion, nontender, soft    Pelvic: Deferred   Extremities: Extremities normal, atraumatic, no cyanosis or edema, pulses 2+ symmetrically   Skin: Skin color, texture, turgor normal, no rashes or lesions   Lymph nodes: No cervical, supraclavicular, and axillary adenopathy   Neurologic: Alert, oriented x3, nonfocal exam       Assessment     1. Dysphagia, unspecified type    2. Constipation, unspecified constipation type        Plan     No orders of the defined types were placed in this encounter.    Follow up in about 8 weeks (around 07/27/2019).  There are no Patient Instructions on file for this visit.  Discontinued Medications    No medications on file     Modified Medications    Modified Medication Previous Medication    LINACLOTIDE (LINZESS) 72 MCG CAPSULE linaclotide (LINZESS) 72 mcg capsule       Take one capsule (72 mcg dose) by mouth daily.    Take one capsule (72 mcg dose) by mouth daily.     New Prescriptions    No medications on file     Discussion and Summary:  I have  asked the patient to restart her Linzess at 72 mcg daily.  I have asked her to try and keep a stool log so we can see how the medication will need to be adjusted.  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  I have explained to her with her that this is not likely something that we will be able to fix but that she will need to be careful when she eats and should try to stick with soft foods and to make sure that she follows her solid food with liquid I explained to her that her esophageal cyst is likely not causing hoarseness in her voices.  As it is located further in her esophagus and not near her vocal cords.    Electronically Signed:  Adolphus Birchwood, MD  06/01/2019 12:04 PM        Electronically signed by Adolphus Birchwood, MD at 06/01/2019 12:06 PM EDT

## 2020-05-04 ENCOUNTER — Encounter: Payer: Self-pay | Admitting: Urology

## 2020-05-04 ENCOUNTER — Other Ambulatory Visit: Payer: Self-pay

## 2020-05-04 ENCOUNTER — Ambulatory Visit (INDEPENDENT_AMBULATORY_CARE_PROVIDER_SITE_OTHER): Payer: Medicare Other | Admitting: Urology

## 2020-05-04 ENCOUNTER — Ambulatory Visit (HOSPITAL_COMMUNITY): Admission: RE | Admit: 2020-05-04 | Payer: Medicare Other | Source: Ambulatory Visit

## 2020-05-04 VITALS — BP 92/60 | HR 79 | Temp 98.3°F | Ht 67.0 in | Wt 79.0 lb

## 2020-05-04 DIAGNOSIS — D250 Submucous leiomyoma of uterus: Secondary | ICD-10-CM | POA: Diagnosis not present

## 2020-05-04 DIAGNOSIS — N200 Calculus of kidney: Secondary | ICD-10-CM | POA: Diagnosis not present

## 2020-05-04 DIAGNOSIS — R1011 Right upper quadrant pain: Secondary | ICD-10-CM | POA: Diagnosis not present

## 2020-05-04 NOTE — Patient Instructions (Signed)

## 2020-05-04 NOTE — Progress Notes (Signed)
Urological Symptom Review  Patient self cath  Patient is experiencing the following symptoms: Frequent urination Hard to postpone urination Stream starts and stops Trouble starting stream  Kidney stones   Review of Systems  Gastrointestinal (upper)  : Nausea Vomiting  Gastrointestinal (lower) : Constipation  Constitutional : Weight loss  Skin: Negative for skin symptoms  Eyes: Blurred vision  Ear/Nose/Throat : Negative for Ear/Nose/Throat symptoms  Hematologic/Lymphatic: Easy bruising  Cardiovascular : Negative for cardiovascular symptoms  Respiratory : Negative for respiratory symptoms  Endocrine: Negative for endocrine symptoms  Musculoskeletal: Back pain  Neurological: Headaches  Psychologic: Negative for psychiatric symptoms

## 2020-05-04 NOTE — Progress Notes (Signed)
05/04/2020 11:32 AM   Jody [name] 10-May-1940 [id number]  Referring provider: Celedonio Savage, MD [place],  [place] [address]  nephrolithiasis  HPI: [name] is a 80yo here for evaluation of nephrolithiasis. She was diagnosed with a right renal calculus 1 year ago but the imaging is not available. She has intermittent, sharp, moderate nonraditing right flank pain.  No hematuria. No significant LUTS.  She was previously seen in this office  She has difficulty urinating and performs CIC 2-3x per day. No UTIs.    PMH: Past Medical History:  Diagnosis Date  . Back pain   . Constipation 12/2017  . COPD (chronic obstructive pulmonary disease) ([place])   . Heart murmur   . Osteoarthrosis     Surgical History: Past Surgical History:  Procedure Laterality Date  . EPIDURAL BLOCK INJECTION     a few ago in [place]  . HEMORROIDECTOMY    . PARTIAL COLECTOMY     for constipation    Home Medications:  Allergies as of 05/04/2020      Reactions   Contrast Media [iodinated Diagnostic Agents] Other (See Comments)   Pain in her chest   Barium-containing Compounds Other (See Comments)   Patient claims made her throat peel   Cephalosporins Nausea And Vomiting, Nausea Only, Other (See Comments), Rash, Hives   Ciprofloxacin Hives   Sulfa Antibiotics Hives, Nausea And Vomiting   Brompheniramine    Ceclor [cefaclor] Hives   Codeine Other (See Comments)   whelps   Dimetapp [albertsons Di Bromm] Hives   Penicillins Hives, Nausea And Vomiting   Latex Rash   Other Other (See Comments), Rash   "anything that puts you to sleep" "anything that puts you to sleep"      Medication List       Accurate as of May 04, 2020 11:32 AM. If you have any questions, ask your nurse or doctor.        ciprofloxacin 500 MG tablet Commonly known as: CIPRO Take 500 mg by mouth 2 (two) times daily.   docusate sodium 100 MG capsule Commonly known as: COLACE Take 100 mg by mouth daily.    estradiol 0.1 MG/GM vaginal cream Commonly known as: ESTRACE Place vaginally.   gabapentin 300 MG capsule Commonly known as: NEURONTIN Take 300 mg by mouth 3 (three) times daily.   lubiprostone 8 MCG capsule Commonly known as: AMITIZA Take by mouth.   omeprazole 20 MG capsule Commonly known as: PriLOSEC Take 1 capsule (20 mg total) by mouth 2 (two) times daily.   polyethylene glycol 17 g packet Commonly known as: MiraLax Take 17 g by mouth daily.   promethazine 12.5 MG tablet Commonly known as: PHENERGAN Take by mouth.   Trulance 3 MG Tabs Generic drug: Plecanatide Take 1 tablet by mouth daily.   Vitamin D 50 MCG (2000 UT) tablet Take 2,000 Units by mouth daily.       Allergies:  Allergies  Allergen Reactions  . Contrast Media [Iodinated Diagnostic Agents] Other (See Comments)    Pain in her chest  . Barium-Containing Compounds Other (See Comments)    Patient claims made her throat peel  . Cephalosporins Nausea And Vomiting, Nausea Only, Other (See Comments), Rash and Hives  . Ciprofloxacin Hives  . Sulfa Antibiotics Hives and Nausea And Vomiting  . Brompheniramine   . Ceclor [Cefaclor] Hives  . Codeine Other (See Comments)    whelps  . Dimetapp [Albertsons Di Bromm] Hives  .  Penicillins Hives and Nausea And Vomiting  . Latex Rash  . Other Other (See Comments) and Rash    "anything that puts you to sleep" "anything that puts you to sleep"    Family History: History reviewed. No pertinent family history.  Social History:  reports that she has been smoking cigarettes. She has a 0.50 pack-year smoking history. She has never used smokeless tobacco. She reports that she does not drink alcohol and does not use drugs.  ROS: All other review of systems were reviewed and are negative except what is noted above in HPI  Physical Exam: BP 92/60   Pulse 79   Temp 98.3 F (36.8 C)   Ht 5\' 7"  (1.702 m)   Wt 79 lb (35.8 kg)   BMI 12.37 kg/m   Constitutional:   Alert and oriented, No acute distress. HEENT: [place] AT, moist mucus membranes.  Trachea midline, no masses. Cardiovascular: No clubbing, cyanosis, or edema. Respiratory: Normal respiratory effort, no increased work of breathing. GI: Abdomen is soft, nontender, nondistended, no abdominal masses GU: No CVA tenderness.  Lymph: No cervical or inguinal lymphadenopathy. Skin: No rashes, bruises or suspicious lesions. Neurologic: Grossly intact, no focal deficits, moving all 4 extremities. Psychiatric: Normal mood and affect.  Laboratory Data: Lab Results  Component Value Date   WBC 4.0 12/21/2017   HGB 12.0 12/21/2017   HCT 37.5 12/21/2017   MCV 93.3 12/21/2017   PLT 127 (L) 12/21/2017    Lab Results  Component Value Date   CREATININE 0.77 12/21/2017    No results found for: PSA  No results found for: TESTOSTERONE  No results found for: HGBA1C  Urinalysis    Component Value Date/Time   COLORURINE YELLOW 12/20/2017 1246   APPEARANCEUR HAZY (A) 12/20/2017 1246   LABSPEC 1.014 12/20/2017 1246   PHURINE 6.0 12/20/2017 1246   GLUCOSEU NEGATIVE 12/20/2017 1246   HGBUR NEGATIVE 12/20/2017 1246   BILIRUBINUR NEGATIVE 12/20/2017 1246   KETONESUR 5 (A) 12/20/2017 1246   PROTEINUR NEGATIVE 12/20/2017 1246   NITRITE NEGATIVE 12/20/2017 1246   LEUKOCYTESUR SMALL (A) 12/20/2017 1246    Lab Results  Component Value Date   BACTERIA NONE SEEN 12/20/2017    Pertinent Imaging:  No results found for this or any previous visit.  No results found for this or any previous visit.  No results found for this or any previous visit.  No results found for this or any previous visit.  Results for orders placed during the hospital encounter of 04/27/08  US Renal  Narrative Clinical Data: Difficulty voiding, recurrent urinary tract infections  RENAL/URINARY TRACT ULTRASOUND COMPLETE  Comparison: None  Findings:  Comparison: None  Findings:  Right Kidney = 10.4 cm.  No  evidence of hydronephrosis, cyst, mass, or stone.  Left kidney = 11.0 cm.  No evidence of hydronephrosis, cyst, mass, or stone.  Bladder:  Bladder distended.  No irregularity identified. Postvoid residual equals 150 ml  IMPRESSION:  1.  Normal renal ultrasound. 2.  Small postvoid residual.  Provider: Eldridge Scot  No results found for this or any previous visit.  No results found for this or any previous visit.  No results found for this or any previous visit.   Assessment & Plan:    1. Kidney stone -The patient is out of network for [facility] and cannot have a CT stone study at this facility. I will refer her to [facility].  - Urinalysis, Routine w reflex microscopic   No follow-ups on  file.  Nicolette Bang, MD  [facility] [place]

## 2021-05-26 ENCOUNTER — Other Ambulatory Visit: Payer: Self-pay

## 2021-05-26 ENCOUNTER — Encounter (HOSPITAL_COMMUNITY): Payer: Self-pay | Admitting: Emergency Medicine

## 2021-05-26 ENCOUNTER — Emergency Department (HOSPITAL_COMMUNITY)
Admission: EM | Admit: 2021-05-26 | Discharge: 2021-05-26 | Disposition: A | Discharge status: Home / Self Care | Payer: Medicare Other | Attending: Emergency Medicine | Admitting: Emergency Medicine

## 2021-05-26 DIAGNOSIS — Z5321 Procedure and treatment not carried out due to patient leaving prior to being seen by health care provider: Secondary | ICD-10-CM | POA: Diagnosis not present

## 2021-05-26 DIAGNOSIS — R1030 Lower abdominal pain, unspecified: Secondary | ICD-10-CM | POA: Insufficient documentation

## 2021-05-26 DIAGNOSIS — R110 Nausea: Secondary | ICD-10-CM | POA: Insufficient documentation

## 2021-05-26 LAB — BASIC METABOLIC PANEL
Anion gap: 10 (ref 5–15)
BUN: 27 mg/dL — ABNORMAL HIGH (ref 8–23)
CO2: 27 mmol/L (ref 22–32)
Calcium: 9.1 mg/dL (ref 8.9–10.3)
Chloride: 103 mmol/L (ref 98–111)
Creatinine, Ser: 1.12 mg/dL — ABNORMAL HIGH (ref 0.44–1.00)
GFR, Estimated: 50 mL/min — ABNORMAL LOW (ref >60)
Glucose, Bld: 114 mg/dL — ABNORMAL HIGH (ref 70–99)
Potassium: 2.9 mmol/L — ABNORMAL LOW (ref 3.5–5.1)
Sodium: 140 mmol/L (ref 135–145)

## 2021-05-26 LAB — CBC WITH DIFFERENTIAL/PLATELET
Abs Immature Granulocytes: 0.03 10*3/uL (ref 0.00–0.07)
Basophils Absolute: 0 10*3/uL (ref 0.0–0.1)
Basophils Relative: 1 %
Eosinophils Absolute: 0 10*3/uL (ref 0.0–0.5)
Eosinophils Relative: 0 %
HCT: 37.8 % (ref 36.0–46.0)
Hemoglobin: 12.4 g/dL (ref 12.0–15.0)
Immature Granulocytes: 0 %
Lymphocytes Relative: 17 %
Lymphs Abs: 1.5 10*3/uL (ref 0.7–4.0)
MCH: 30.3 pg (ref 26.0–34.0)
MCHC: 32.8 g/dL (ref 30.0–36.0)
MCV: 92.4 fL (ref 80.0–100.0)
Monocytes Absolute: 0.9 10*3/uL (ref 0.1–1.0)
Monocytes Relative: 11 %
Neutro Abs: 6.3 10*3/uL (ref 1.7–7.7)
Neutrophils Relative %: 71 %
Platelets: 197 10*3/uL (ref 150–400)
RBC: 4.09 MIL/uL (ref 3.87–5.11)
RDW: 13.5 % (ref 11.5–15.5)
WBC: 8.8 10*3/uL (ref 4.0–10.5)
nRBC: 0 % (ref 0.0–0.2)

## 2021-05-26 LAB — LIPASE, BLOOD: Lipase: 20 U/L (ref 11–51)

## 2021-05-26 NOTE — ED Triage Notes (Signed)
Pt c/o lower abdominal pain and RT flank pain x 2 weeks since she had a stent placed for kidney stones. Pt endorses nausea.  ?

## 2021-06-20 ENCOUNTER — Emergency Department (HOSPITAL_COMMUNITY): Payer: Medicare Other

## 2021-06-20 ENCOUNTER — Emergency Department (HOSPITAL_COMMUNITY)
Admission: EM | Admit: 2021-06-20 | Discharge: 2021-06-20 | Disposition: A | Discharge status: Home / Self Care | Payer: Medicare Other | Attending: Emergency Medicine | Admitting: Emergency Medicine

## 2021-06-20 ENCOUNTER — Encounter (HOSPITAL_COMMUNITY): Payer: Self-pay | Admitting: Emergency Medicine

## 2021-06-20 ENCOUNTER — Other Ambulatory Visit: Payer: Self-pay

## 2021-06-20 DIAGNOSIS — Z91040 Latex allergy status: Secondary | ICD-10-CM | POA: Diagnosis not present

## 2021-06-20 DIAGNOSIS — W19XXXA Unspecified fall, initial encounter: Secondary | ICD-10-CM | POA: Diagnosis not present

## 2021-06-20 DIAGNOSIS — G8929 Other chronic pain: Secondary | ICD-10-CM | POA: Insufficient documentation

## 2021-06-20 DIAGNOSIS — S3992XA Unspecified injury of lower back, initial encounter: Secondary | ICD-10-CM | POA: Diagnosis present

## 2021-06-20 DIAGNOSIS — S22081A Stable burst fracture of T11-T12 vertebra, initial encounter for closed fracture: Secondary | ICD-10-CM

## 2021-06-20 LAB — CBC WITH DIFFERENTIAL/PLATELET
Abs Immature Granulocytes: 0.02 10*3/uL (ref 0.00–0.07)
Basophils Absolute: 0.1 10*3/uL (ref 0.0–0.1)
Basophils Relative: 1 %
Eosinophils Absolute: 0.1 10*3/uL (ref 0.0–0.5)
Eosinophils Relative: 2 %
HCT: 36.6 % (ref 36.0–46.0)
Hemoglobin: 11.7 g/dL — ABNORMAL LOW (ref 12.0–15.0)
Immature Granulocytes: 0 %
Lymphocytes Relative: 29 %
Lymphs Abs: 1.8 10*3/uL (ref 0.7–4.0)
MCH: 30.2 pg (ref 26.0–34.0)
MCHC: 32 g/dL (ref 30.0–36.0)
MCV: 94.6 fL (ref 80.0–100.0)
Monocytes Absolute: 0.4 10*3/uL (ref 0.1–1.0)
Monocytes Relative: 7 %
Neutro Abs: 3.6 10*3/uL (ref 1.7–7.7)
Neutrophils Relative %: 61 %
Platelets: 201 10*3/uL (ref 150–400)
RBC: 3.87 MIL/uL (ref 3.87–5.11)
RDW: 13.2 % (ref 11.5–15.5)
WBC: 6 10*3/uL (ref 4.0–10.5)
nRBC: 0 % (ref 0.0–0.2)

## 2021-06-20 LAB — BASIC METABOLIC PANEL
Anion gap: 3 — ABNORMAL LOW (ref 5–15)
BUN: 20 mg/dL (ref 8–23)
CO2: 32 mmol/L (ref 22–32)
Calcium: 8.7 mg/dL — ABNORMAL LOW (ref 8.9–10.3)
Chloride: 104 mmol/L (ref 98–111)
Creatinine, Ser: 0.68 mg/dL (ref 0.44–1.00)
GFR, Estimated: >60 mL/min (ref >60)
Glucose, Bld: 109 mg/dL — ABNORMAL HIGH (ref 70–99)
Potassium: 3.2 mmol/L — ABNORMAL LOW (ref 3.5–5.1)
Sodium: 139 mmol/L (ref 135–145)

## 2021-06-20 MED ORDER — ACETAMINOPHEN 325 MG PO TABS
650.0000 mg | ORAL_TABLET | Freq: Once | ORAL | Status: AC
  Administered 2021-06-20: 650 mg via ORAL
  Filled 2021-06-20: qty 2

## 2021-06-20 MED ORDER — POTASSIUM CHLORIDE CRYS ER 20 MEQ PO TBCR
40.0000 meq | EXTENDED_RELEASE_TABLET | Freq: Once | ORAL | Status: AC
  Administered 2021-06-20: 40 meq via ORAL
  Filled 2021-06-20: qty 2

## 2021-06-20 MED ORDER — OXYCODONE-ACETAMINOPHEN 5-325 MG PO TABS: 1.0000 | ORAL_TABLET | Freq: Four times a day (QID) | ORAL | 0 refills | Status: DC | PRN

## 2021-06-20 MED ORDER — MORPHINE SULFATE (PF) 4 MG/ML IV SOLN
4.0000 mg | Freq: Once | INTRAVENOUS | Status: AC
  Administered 2021-06-20: 4 mg via INTRAMUSCULAR
  Filled 2021-06-20: qty 1

## 2021-06-20 MED ORDER — OXYCODONE-ACETAMINOPHEN 5-325 MG PO TABS
1.0000 | ORAL_TABLET | Freq: Once | ORAL | Status: AC
  Administered 2021-06-20: 1 via ORAL
  Filled 2021-06-20: qty 1

## 2021-06-20 NOTE — Discharge Instructions (Signed)
Your test today showed a fracture of the T11 spine as well as the L1 spine.  Keep the TLSO brace on.  Follow-up with spine specialist in 2 weeks.  Return to the ER if your symptoms worsen you have new numbness weakness or any additional concerns.

## 2021-06-20 NOTE — ED Notes (Signed)
On call person for [facility] returned call and advised it would take approximately an hour for her to arrive  at hospital.

## 2021-06-20 NOTE — ED Notes (Signed)
Notified on call person @ [facility] of patient needing a TLSO brace.

## 2021-06-20 NOTE — ED Provider Notes (Signed)
[facility] Provider Note   CSN: [id number] Arrival date & time: 06/20/21  1642     History  Chief Complaint  Patient presents with   Jody [name] is a 81 y.o. female.  Patient presents chief complaint of upper back pain.  Describes mid upper back pain ongoing for 2 days.  Pain initiated after she fell yesterday and has been persistent.  She has a history of chronic back pain but states that this feels different.  Denies head injury or loss of consciousness.  No reports of fevers or cough or vomiting or diarrhea.  Patient still transferring from her wheelchair to the walker with assistance.  Taking Tylenol and Motrin at home without significant relief of pain.      Home Medications Prior to Admission medications   Medication Sig Start Date End Date Taking? Authorizing Provider  oxyCODONE-acetaminophen (PERCOCET/ROXICET) 5-325 MG tablet Take 1 tablet by mouth every 6 (six) hours as needed for up to 12 doses for severe pain. 06/20/21  Yes Luna Fuse, MD  Cholecalciferol (VITAMIN D) 50 MCG (2000 UT) tablet Take 2,000 Units by mouth daily.    [provider]  ciprofloxacin (CIPRO) 500 MG tablet Take 500 mg by mouth 2 (two) times daily. Patient not taking: Reported on 05/04/2020 04/19/20   [provider]  docusate sodium (COLACE) 100 MG capsule Take 100 mg by mouth daily. 02/23/20   [provider]  estradiol (ESTRACE) 0.1 MG/GM vaginal cream Place vaginally. 11/11/19   [provider]  gabapentin (NEURONTIN) 300 MG capsule Take 300 mg by mouth 3 (three) times daily.    [provider]  lubiprostone (AMITIZA) 8 MCG capsule Take by mouth. Patient not taking: Reported on 05/04/2020 11/25/19   [provider]  omeprazole (PRILOSEC) 20 MG capsule Take 1 capsule (20 mg total) by mouth 2 (two) times daily. Patient not taking: Reported on 12/20/2017 01/24/11 01/24/12  Butch Penny, NP  polyethylene glycol  [facility]) packet Take 17 g by mouth daily. 12/21/17   Carroll Sage, MD  promethazine (PHENERGAN) 12.5 MG tablet Take by mouth. 04/18/20   [provider]  TRULANCE 3 MG TABS Take 1 tablet by mouth daily. Patient not taking: Reported on 05/04/2020 04/11/20   [provider]      Allergies    Contrast media [iodinated contrast media], Barium-containing compounds, Cephalosporins, Ciprofloxacin, Sulfa antibiotics, Brompheniramine, Ceclor [cefaclor], Codeine, Dimetapp [albertsons di bromm], Penicillins, Latex, and Other    Review of Systems   Review of Systems  Constitutional:  Negative for fever.  HENT:  Negative for ear pain.   Eyes:  Negative for pain.  Respiratory:  Negative for cough.   Cardiovascular:  Negative for chest pain.  Gastrointestinal:  Negative for abdominal pain.  Genitourinary:  Negative for flank pain.  Musculoskeletal:  Positive for back pain.  Skin:  Negative for rash.  Neurological:  Negative for headaches.   Physical Exam Updated Vital Signs BP 118/67   Pulse 85   Temp 98.2 F (36.8 C) (Oral)   Resp 19   Ht '5\' 7"'$  (1.702 m)   Wt 44.5 kg   SpO2 93%   BMI 15.35 kg/m  Physical Exam Constitutional:      General: She is not in acute distress.    Appearance: Normal appearance.  HENT:     Head: Normocephalic.     Nose: Nose normal.  Eyes:     Extraocular Movements: Extraocular movements  intact.  Cardiovascular:     Rate and Rhythm: Normal rate.  Pulmonary:     Effort: Pulmonary effort is normal.  Abdominal:     General: There is no distension.     Tenderness: There is no abdominal tenderness. There is no rebound.  Musculoskeletal:        General: Normal range of motion.     Cervical back: Normal range of motion.     Comments: Thoracic tenderness noted in the mid upper back between her shoulder blades.  Otherwise no C-spine tenderness no L-spine tenderness noted.  Ranging bilateral shoulders wrists and elbows and hips knees and ankles  without any pain.  No extremity deformity noted.   Neurological:     General: No focal deficit present.     Mental Status: She is alert. Mental status is at baseline.     Cranial Nerves: No cranial nerve deficit.     Motor: No weakness.    ED Results / Procedures / Treatments   Labs (all labs ordered are listed, but only abnormal results are displayed) Labs Reviewed  CBC WITH DIFFERENTIAL/PLATELET - Abnormal; Notable for the following components:      Result Value   Hemoglobin 11.7 (*)    All other components within normal limits  BASIC METABOLIC PANEL - Abnormal; Notable for the following components:   Potassium 3.2 (*)    Glucose, Bld 109 (*)    Calcium 8.7 (*)    Anion gap 3 (*)    All other components within normal limits    EKG None  Radiology DG Chest 1 View  Result Date: 06/20/2021 CLINICAL DATA:  [id number].  Fell region cabinet EXAM: CHEST  1 VIEW COMPARISON:  Chest x-ray 04/24/2016 report without imaging FINDINGS: Prominent cardiac silhouette likely due to AP portable technique. The heart and mediastinal contours are within normal limits. Aortic calcification. Hyperinflation. No focal consolidation. Coarsened interstitial markings with no overt pulmonary edema. No pleural effusion. No pneumothorax. No acute osseous abnormality. IMPRESSION: 1. No active disease. 2.  Aortic Atherosclerosis (ICD10-I70.0). Electronically Signed   By: Iven Finn M.D.   On: 06/20/2021 18:10   CT Head Wo Contrast  Result Date: 06/20/2021 CLINICAL DATA:  Head trauma, moderate-severe EXAM: CT HEAD WITHOUT CONTRAST TECHNIQUE: Contiguous axial images were obtained from the base of the skull through the vertex without intravenous contrast. RADIATION DOSE REDUCTION: This exam was performed according to the departmental dose-optimization program which includes automated exposure control, adjustment of the mA and/or kV according to patient size and/or use of iterative reconstruction technique.  COMPARISON:  None Available. BRAIN: BRAIN Cerebral ventricle sizes are concordant with the degree of cerebral volume loss. Patchy and confluent areas of decreased attenuation are noted throughout the deep and periventricular white matter of the cerebral hemispheres bilaterally, compatible with chronic microvascular ischemic disease. No evidence of large-territorial acute infarction. No parenchymal hemorrhage. No mass lesion. No extra-axial collection. No mass effect or midline shift. No hydrocephalus. Basilar cisterns are patent. Vascular: No hyperdense vessel. Atherosclerotic calcifications are present within the cavernous internal carotid arteries. Skull: No acute fracture or focal lesion. Sinuses/Orbits: Paranasal sinuses and mastoid air cells are clear. Right lens replacement. Otherwise the orbits are unremarkable. Other: None. IMPRESSION: No acute intracranial abnormality. Electronically Signed   By: Iven Finn M.D.   On: 06/20/2021 18:25   CT Thoracic Spine Wo Contrast  Result Date: 06/20/2021 CLINICAL DATA:  Back trauma, no prior imaging (Age >= 16y); Ataxia, thoracic trauma. Status post fall. Back  pain worse with movement. EXAM: CT THORACIC AND LUMBAR SPINE WITHOUT CONTRAST TECHNIQUE: Multidetector CT imaging of the thoracic and lumbar spine was performed without contrast. Multiplanar CT image reconstructions were also generated. RADIATION DOSE REDUCTION: This exam was performed according to the departmental dose-optimization program which includes automated exposure control, adjustment of the mA and/or kV according to patient size and/or use of iterative reconstruction technique. COMPARISON:  CT angiography abdominal aorta 06/27/2010. CT abdomen pelvis 12/20/2017. MRI thoracolumbar spine 07/07/2010 FINDINGS: CT THORACIC SPINE FINDINGS Alignment: 12 rib-bearing thoracic vertebral bodies. Normal alignment. Vertebrae: Chronic appearing T11 complete burst fracture with at least 60% height loss and 4 mm  retropulsion into the central canal. Paraspinal and other soft tissues: Negative. Disc levels: Maintained. CT LUMBAR SPINE FINDINGS Segmentation: 6 lumbar type vertebrae. These are numbered L1 through L6. Alignment: Normal. Vertebrae: Age-indeterminate inferior endplate L1 compression fracture with at least 20% height loss. Similar-appearing kyphoplasty of the L3 level. Multilevel mild facet arthropathy. L5-S1 endplate sclerosis. Paraspinal and other soft tissues: Negative. Disc levels: L5-S1 intervertebral disc space narrowing. Other: Aortic calcification. Sigmoid surgical changes. Nonobstructive 2 mm right nephrolithiasis. IMPRESSION: CT THORACIC SPINE IMPRESSION Chronic appearing T11 complete burst fracture with at least 60% height loss and 4 mm retropulsion into the central canal. Recommend clinical correlation with tenderness to palpation to evaluate for an acute component. CT LUMBAR SPINE IMPRESSION Age-indeterminate inferior endplate L1 compression fracture with at least 20% height loss. Recommend clinical correlation with tenderness to palpation to evaluate for an acute component. Aortic Atherosclerosis (ICD10-I70.0). Electronically Signed   By: Iven Finn M.D.   On: 06/20/2021 18:37   CT Lumbar Spine Wo Contrast  Result Date: 06/20/2021 CLINICAL DATA:  Back trauma, no prior imaging (Age >= 16y); Ataxia, thoracic trauma. Status post fall. Back pain worse with movement. EXAM: CT THORACIC AND LUMBAR SPINE WITHOUT CONTRAST TECHNIQUE: Multidetector CT imaging of the thoracic and lumbar spine was performed without contrast. Multiplanar CT image reconstructions were also generated. RADIATION DOSE REDUCTION: This exam was performed according to the departmental dose-optimization program which includes automated exposure control, adjustment of the mA and/or kV according to patient size and/or use of iterative reconstruction technique. COMPARISON:  CT angiography abdominal aorta 06/27/2010. CT abdomen pelvis  12/20/2017. MRI thoracolumbar spine 07/07/2010 FINDINGS: CT THORACIC SPINE FINDINGS Alignment: 12 rib-bearing thoracic vertebral bodies. Normal alignment. Vertebrae: Chronic appearing T11 complete burst fracture with at least 60% height loss and 4 mm retropulsion into the central canal. Paraspinal and other soft tissues: Negative. Disc levels: Maintained. CT LUMBAR SPINE FINDINGS Segmentation: 6 lumbar type vertebrae. These are numbered L1 through L6. Alignment: Normal. Vertebrae: Age-indeterminate inferior endplate L1 compression fracture with at least 20% height loss. Similar-appearing kyphoplasty of the L3 level. Multilevel mild facet arthropathy. L5-S1 endplate sclerosis. Paraspinal and other soft tissues: Negative. Disc levels: L5-S1 intervertebral disc space narrowing. Other: Aortic calcification. Sigmoid surgical changes. Nonobstructive 2 mm right nephrolithiasis. IMPRESSION: CT THORACIC SPINE IMPRESSION Chronic appearing T11 complete burst fracture with at least 60% height loss and 4 mm retropulsion into the central canal. Recommend clinical correlation with tenderness to palpation to evaluate for an acute component. CT LUMBAR SPINE IMPRESSION Age-indeterminate inferior endplate L1 compression fracture with at least 20% height loss. Recommend clinical correlation with tenderness to palpation to evaluate for an acute component. Aortic Atherosclerosis (ICD10-I70.0). Electronically Signed   By: Iven Finn M.D.   On: 06/20/2021 18:37   MR THORACIC SPINE WO CONTRAST  Result Date: 06/20/2021 CLINICAL DATA:  Ataxia and thoracic  trauma EXAM: MRI THORACIC SPINE WITHOUT CONTRAST TECHNIQUE: Multiplanar, multisequence MR imaging of the thoracic spine was performed. No intravenous contrast was administered. COMPARISON:  None Available. FINDINGS: Alignment:  Physiologic. Vertebrae: There is a burst fracture of T11 with 50% height loss and 4 mm of retropulsion at the posterosuperior corner. There is diffuse bone  marrow edema. At L1, there is a mildly depressed fracture of the inferior endplate with less than 25% height loss and moderate bone marrow edema. Cord:  Normal signal and morphology. Paraspinal and other soft tissues: Unremarkable Disc levels: There is mild spinal canal stenosis with indentation of the ventral spinal cord at T11, but the dorsal CSF space remains open. IMPRESSION: 1. Acute burst fracture of T11 with 50% height loss and 4 mm of retropulsion at the posterosuperior corner with mild spinal canal stenosis. 2. Acute L1 inferior endplate fracture with less than 25% height loss and moderate bone marrow edema. Electronically Signed   By: Ulyses Jarred M.D.   On: 06/20/2021 20:08    Procedures Procedures    Medications Ordered in ED Medications  acetaminophen (TYLENOL) tablet 650 mg (650 mg Oral Given 06/20/21 1813)  morphine (PF) 4 MG/ML injection 4 mg (4 mg Intramuscular Given 06/20/21 1908)  potassium chloride SA (KLOR-CON M) CR tablet 40 mEq (40 mEq Oral Given 06/20/21 2108)  oxyCODONE-acetaminophen (PERCOCET/ROXICET) 5-325 MG per tablet 1 tablet (1 tablet Oral Given 06/20/21 2108)    ED Course/ Medical Decision Making/ A&P Clinical Course as of 06/20/21 2257  Tue Jun 20, 2021  1837 DG Chest 1 View [JH]    Clinical Course User Index [JH] Almyra Free Greggory Brandy, MD                           Medical Decision Making Amount and/or Complexity of Data Reviewed Labs: ordered. Radiology: ordered. Decision-making details documented in ED Course.  Risk OTC drugs. Prescription drug management.   History obtained from family at bedside.  Chart review shows history of chronic back pain.  Diagnostic studies sent these include CT imaging concerning for age-indeterminate burst fracture of the T11 spine.  MRI of this area pursued concerning for acute injury.  Consultation with spine surgery, recommending TLSO brace and outpatient follow-up in the clinic in 2 weeks.  Patient's pain appears well  controlled with medications provided here in the ER.  Pending TLSO brace fitting, anticipate discharge home, patient agreeable to plan, son at bedside.  Advised immediate return for new numbness weakness or worsening symptoms or any additional concerns.        Final Clinical Impression(s) / ED Diagnoses Final diagnoses:  Closed stable burst fracture of eleventh thoracic vertebra, initial encounter [facility])    Rx / DC Orders ED Discharge Orders          Ordered    oxyCODONE-acetaminophen (PERCOCET/ROXICET) 5-325 MG tablet  Every 6 hours PRN        06/20/21 2257              Luna Fuse, MD 06/20/21 2257

## 2021-06-20 NOTE — ED Triage Notes (Signed)
Pt fell reaching into cabinet yesterday injuring mid-back, denies LOC.

## 2021-06-20 NOTE — ED Notes (Addendum)
Pt transported to MRI 

## 2021-06-22 ENCOUNTER — Emergency Department (HOSPITAL_COMMUNITY)
Admission: EM | Admit: 2021-06-22 | Discharge: 2021-06-23 | Disposition: A | Discharge status: Home / Self Care | Payer: Medicare Other | Attending: Emergency Medicine | Admitting: Emergency Medicine

## 2021-06-22 ENCOUNTER — Other Ambulatory Visit: Payer: Self-pay

## 2021-06-22 ENCOUNTER — Emergency Department (HOSPITAL_COMMUNITY): Payer: Medicare Other

## 2021-06-22 ENCOUNTER — Encounter (HOSPITAL_COMMUNITY): Payer: Self-pay | Admitting: Emergency Medicine

## 2021-06-22 DIAGNOSIS — N200 Calculus of kidney: Secondary | ICD-10-CM

## 2021-06-22 DIAGNOSIS — Z91040 Latex allergy status: Secondary | ICD-10-CM | POA: Insufficient documentation

## 2021-06-22 DIAGNOSIS — E876 Hypokalemia: Secondary | ICD-10-CM

## 2021-06-22 DIAGNOSIS — K5900 Constipation, unspecified: Secondary | ICD-10-CM | POA: Diagnosis not present

## 2021-06-22 DIAGNOSIS — R1030 Lower abdominal pain, unspecified: Secondary | ICD-10-CM | POA: Insufficient documentation

## 2021-06-22 LAB — COMPREHENSIVE METABOLIC PANEL
ALT: 13 U/L (ref 0–44)
AST: 16 U/L (ref 15–41)
Albumin: 3.2 g/dL — ABNORMAL LOW (ref 3.5–5.0)
Alkaline Phosphatase: 125 U/L (ref 38–126)
Anion gap: 6 (ref 5–15)
BUN: 19 mg/dL (ref 8–23)
CO2: 29 mmol/L (ref 22–32)
Calcium: 8.2 mg/dL — ABNORMAL LOW (ref 8.9–10.3)
Chloride: 99 mmol/L (ref 98–111)
Creatinine, Ser: 0.94 mg/dL (ref 0.44–1.00)
GFR, Estimated: >60 mL/min (ref >60)
Glucose, Bld: 113 mg/dL — ABNORMAL HIGH (ref 70–99)
Potassium: 3 mmol/L — ABNORMAL LOW (ref 3.5–5.1)
Sodium: 134 mmol/L — ABNORMAL LOW (ref 135–145)
Total Bilirubin: 0.2 mg/dL — ABNORMAL LOW (ref 0.3–1.2)
Total Protein: 6 g/dL — ABNORMAL LOW (ref 6.5–8.1)

## 2021-06-22 LAB — URINALYSIS, ROUTINE W REFLEX MICROSCOPIC
Bilirubin Urine: NEGATIVE
Glucose, UA: NEGATIVE mg/dL
Hgb urine dipstick: NEGATIVE
Ketones, ur: NEGATIVE mg/dL
Nitrite: POSITIVE — AB
Protein, ur: 30 mg/dL — AB
Specific Gravity, Urine: 1.017 (ref 1.005–1.030)
Trans Epithel, UA: <1
pH: 5 (ref 5.0–8.0)

## 2021-06-22 LAB — CBC WITH DIFFERENTIAL/PLATELET
Abs Immature Granulocytes: 0.01 10*3/uL (ref 0.00–0.07)
Basophils Absolute: 0 10*3/uL (ref 0.0–0.1)
Basophils Relative: 0 %
Eosinophils Absolute: 0.2 10*3/uL (ref 0.0–0.5)
Eosinophils Relative: 3 %
HCT: 34.4 % — ABNORMAL LOW (ref 36.0–46.0)
Hemoglobin: 10.7 g/dL — ABNORMAL LOW (ref 12.0–15.0)
Immature Granulocytes: 0 %
Lymphocytes Relative: 28 %
Lymphs Abs: 1.5 10*3/uL (ref 0.7–4.0)
MCH: 29.8 pg (ref 26.0–34.0)
MCHC: 31.1 g/dL (ref 30.0–36.0)
MCV: 95.8 fL (ref 80.0–100.0)
Monocytes Absolute: 0.5 10*3/uL (ref 0.1–1.0)
Monocytes Relative: 9 %
Neutro Abs: 3.3 10*3/uL (ref 1.7–7.7)
Neutrophils Relative %: 60 %
Platelets: 197 10*3/uL (ref 150–400)
RBC: 3.59 MIL/uL — ABNORMAL LOW (ref 3.87–5.11)
RDW: 13.3 % (ref 11.5–15.5)
WBC: 5.5 10*3/uL (ref 4.0–10.5)
nRBC: 0 % (ref 0.0–0.2)

## 2021-06-22 LAB — LIPASE, BLOOD: Lipase: 25 U/L (ref 11–51)

## 2021-06-22 MED ORDER — MILK AND MOLASSES ENEMA
1.0000 | Freq: Once | RECTAL | Status: AC
Start: 2021-06-22 — End: 2021-06-22
  Administered 2021-06-22: 240 mL via RECTAL
  Filled 2021-06-22: qty 240

## 2021-06-22 MED ORDER — SODIUM CHLORIDE 0.9 % IV BOLUS
500.0000 mL | Freq: Once | INTRAVENOUS | Status: AC
  Administered 2021-06-22: 500 mL via INTRAVENOUS

## 2021-06-22 MED ORDER — POTASSIUM CHLORIDE CRYS ER 20 MEQ PO TBCR
40.0000 meq | EXTENDED_RELEASE_TABLET | Freq: Once | ORAL | Status: AC
  Administered 2021-06-22: 40 meq via ORAL
  Filled 2021-06-22: qty 2

## 2021-06-22 MED ORDER — FENTANYL CITRATE PF 50 MCG/ML IJ SOSY
50.0000 ug | PREFILLED_SYRINGE | Freq: Once | INTRAMUSCULAR | Status: AC
  Administered 2021-06-22: 50 ug via INTRAVENOUS
  Filled 2021-06-22: qty 1

## 2021-06-22 NOTE — ED Provider Notes (Signed)
[facility] Provider Note   CSN: [id number] Arrival date & time: 06/22/21  1719     History  Chief Complaint  Patient presents with   Constipation    Jody [name] is a 81 y.o. female.  HPI 81 year old female with a history of chronic constipation presents with constipation.  She has been dealing with this for 5 days.  She is passing gas but no stool output.  No vomiting.  Over the last 3 days or so she has been having some lower abdominal pain.  Feels like her abdomen is distended.  She has had this before as well with urinary tract infections and chronically has to catheterize herself due to neurogenic bladder.  No fevers.  She has tried Dulcolax, milk of magnesia and MiraLAX but the Dulcolax made her cramp and the other ones were ineffective.  Home Medications Prior to Admission medications   Medication Sig Start Date End Date Taking? Authorizing Provider  Cholecalciferol (VITAMIN D) 50 MCG (2000 UT) tablet Take 2,000 Units by mouth daily.   Yes [provider]  oxyCODONE-acetaminophen (PERCOCET/ROXICET) 5-325 MG tablet Take 1 tablet by mouth every 6 (six) hours as needed for up to 12 doses for severe pain. 06/20/21  Yes Luna Fuse, MD  polyethylene glycol [facility]) packet Take 17 g by mouth daily. 12/21/17  Yes Carroll Sage, MD      Allergies    Contrast media [iodinated contrast media], Barium-containing compounds, Cephalosporins, Ciprofloxacin, Sulfa antibiotics, Brompheniramine, Ceclor [cefaclor], Codeine, Dimetapp [albertsons di bromm], Penicillins, Latex, and Other    Review of Systems   Review of Systems  Constitutional:  Negative for fever.  Gastrointestinal:  Positive for abdominal distention, abdominal pain and constipation. Negative for vomiting.    Physical Exam Updated Vital Signs BP (!) 158/59   Pulse 92   Temp 97.7 F (36.5 C) (Oral)   Resp 17   Ht '5\' 7"'$  (1.702 m)   Wt 44.5 kg   SpO2 91%   BMI 15.37 kg/m  Physical  Exam Vitals and nursing note reviewed. Exam conducted with a chaperone present.  Constitutional:      Appearance: She is well-developed and underweight.  HENT:     Head: Normocephalic and atraumatic.  Pulmonary:     Effort: Pulmonary effort is normal.     Breath sounds: Normal breath sounds.  Abdominal:     General: There is no distension.     Palpations: Abdomen is soft.     Tenderness: There is abdominal tenderness (diffuse, lower).  Genitourinary:    Comments: Diffuse mushy and brown stool. No gross blood. Nothing to really disimpact. Skin:    General: Skin is warm and dry.  Neurological:     Mental Status: She is alert.    ED Results / Procedures / Treatments   Labs (all labs ordered are listed, but only abnormal results are displayed) Labs Reviewed  COMPREHENSIVE METABOLIC PANEL - Abnormal; Notable for the following components:      Result Value   Sodium 134 (*)    Potassium 3.0 (*)    Glucose, Bld 113 (*)    Calcium 8.2 (*)    Total Protein 6.0 (*)    Albumin 3.2 (*)    Total Bilirubin 0.2 (*)    All other components within normal limits  CBC WITH DIFFERENTIAL/PLATELET - Abnormal; Notable for the following components:   RBC 3.59 (*)    Hemoglobin 10.7 (*)    HCT 34.4 (*)  All other components within normal limits  URINALYSIS, ROUTINE W REFLEX MICROSCOPIC - Abnormal; Notable for the following components:   APPearance HAZY (*)    Protein, ur 30 (*)    Nitrite POSITIVE (*)    Leukocytes,Ua SMALL (*)    Bacteria, UA MANY (*)    All other components within normal limits  URINE CULTURE  LIPASE, BLOOD    EKG None  Radiology No results found.  Procedures Procedures    Medications Ordered in ED Medications  milk and molasses enema (240 mLs Rectal Given 06/22/21 2013)  sodium chloride 0.9 % bolus 500 mL (0 mLs Intravenous Stopped 06/22/21 2233)  potassium chloride SA (KLOR-CON M) CR tablet 40 mEq (40 mEq Oral Given 06/22/21 2236)  fentaNYL (SUBLIMAZE)  injection 50 mcg (50 mcg Intravenous Given 06/22/21 2321)    ED Course/ Medical Decision Making/ A&P                           Medical Decision Making Amount and/or Complexity of Data Reviewed External Data Reviewed: radiology and notes. Labs: ordered. Radiology: ordered.  Risk Prescription drug management.   Patient was given enema and she has had many bowel movements since then.  Urinalysis shows what appears to be a urinary tract infection though with her frequent catheterizations she could be colonized.  White blood cell count is normal and potassium is mildly low at 3.0.  However after hearing about her having large amounts of stool come out, she is still reporting significant pain.  I had to convince her to get a CT as she reports she is in too much pain to do this.  She will be given IV fentanyl.  Given she still having pain despite having the bowel movements I think is reasonable to investigate further.  Chart review shows she had a CT a little over a month ago that showed the hydronephrosis but also seem to show a questionable bowel obstruction.  Care transferred to Dr. Dayna Barker with the CT pending.        Final Clinical Impression(s) / ED Diagnoses Final diagnoses:  None    Rx / DC Orders ED Discharge Orders     None         Sherwood Gambler, MD 06/23/21 0020

## 2021-06-22 NOTE — ED Notes (Signed)
Provided milk and molasses enema. Pt was able to have bowel movement. Went in to to change linens and pt is continuing to have movement.

## 2021-06-22 NOTE — ED Triage Notes (Signed)
Pt presents with constipation, per pt will be 5 days tomorrow since she had a bowel movement, pt seen in this ED for fall 2 days ago, but stated she wasn't constipated then.

## 2021-06-23 ENCOUNTER — Telehealth (HOSPITAL_COMMUNITY): Payer: Self-pay | Admitting: Emergency Medicine

## 2021-06-23 MED ORDER — DOCUSATE SODIUM 100 MG PO CAPS: 100.0000 mg | ORAL_CAPSULE | Freq: Two times a day (BID) | ORAL | 0 refills | Status: DC

## 2021-06-23 MED ORDER — CEFUROXIME AXETIL 500 MG PO TABS
500.0000 mg | ORAL_TABLET | Freq: Two times a day (BID) | ORAL | 0 refills | Status: DC
Start: 2021-06-23 — End: 2021-07-19

## 2021-06-23 MED ORDER — POTASSIUM CHLORIDE CRYS ER 20 MEQ PO TBCR: 40.0000 meq | EXTENDED_RELEASE_TABLET | Freq: Two times a day (BID) | ORAL | 0 refills | Status: AC

## 2021-06-23 MED ORDER — DOCUSATE SODIUM 100 MG PO CAPS: 100.0000 mg | ORAL_CAPSULE | Freq: Two times a day (BID) | ORAL | 0 refills | Status: AC

## 2021-06-23 MED ORDER — CEFUROXIME AXETIL 500 MG PO TABS: 500.0000 mg | ORAL_TABLET | Freq: Two times a day (BID) | ORAL | 0 refills | Status: DC

## 2021-06-23 MED ORDER — POTASSIUM CHLORIDE CRYS ER 20 MEQ PO TBCR: 40.0000 meq | EXTENDED_RELEASE_TABLET | Freq: Every day | ORAL | 0 refills | Status: DC

## 2021-06-23 MED ORDER — ONDANSETRON 4 MG PO TBDP: ORAL_TABLET | ORAL | 0 refills | Status: DC

## 2021-06-23 MED ORDER — ONDANSETRON HCL 4 MG PO TABS: 4.0000 mg | ORAL_TABLET | Freq: Four times a day (QID) | ORAL | 0 refills | Status: DC

## 2021-06-23 NOTE — Telephone Encounter (Signed)
Patient did not get printed prescriptions.  Now sent to pharmacy.

## 2021-06-23 NOTE — ED Provider Notes (Signed)
12:15 AM Assumed care from Dr. Regenia Skeeter, please see their note for full history, physical and decision making until this point. In brief this is a 81 y.o. year old female who presented to the ED tonight with Constipation     Enema helped some. Pending CT. Treat urine and K if normal.   Has been on ceftin in past. Will try that. D/w Dr. McDiarmid, recommends close follow up with Dr. Alyson Ingles. Patient will call for an appointment.   Discharge instructions, including strict return precautions for new or worsening symptoms, given. Patient and/or family verbalized understanding and agreement with the plan as described.   Labs, studies and imaging reviewed by myself and considered in medical decision making if ordered. Imaging interpreted by radiology.  Labs Reviewed  COMPREHENSIVE METABOLIC PANEL - Abnormal; Notable for the following components:      Result Value   Sodium 134 (*)    Potassium 3.0 (*)    Glucose, Bld 113 (*)    Calcium 8.2 (*)    Total Protein 6.0 (*)    Albumin 3.2 (*)    Total Bilirubin 0.2 (*)    All other components within normal limits  CBC WITH DIFFERENTIAL/PLATELET - Abnormal; Notable for the following components:   RBC 3.59 (*)    Hemoglobin 10.7 (*)    HCT 34.4 (*)    All other components within normal limits  URINALYSIS, ROUTINE W REFLEX MICROSCOPIC - Abnormal; Notable for the following components:   APPearance HAZY (*)    Protein, ur 30 (*)    Nitrite POSITIVE (*)    Leukocytes,Ua SMALL (*)    Bacteria, UA MANY (*)    All other components within normal limits  URINE CULTURE  LIPASE, BLOOD    CT ABDOMEN PELVIS WO CONTRAST    (Results Pending)    No follow-ups on file.    Demya Scruggs, Corene Cornea, MD 06/23/21 0100

## 2021-06-25 LAB — URINE CULTURE: Culture: >=100000 — AB

## 2021-06-26 ENCOUNTER — Telehealth: Payer: Self-pay

## 2021-06-26 NOTE — Telephone Encounter (Signed)
Post ED Visit - Positive Culture Follow-up  Culture report reviewed by antimicrobial stewardship pharmacist: [facility] Team '[x]'$  Sherlon Handing, Pharm.D. '[]'$  Heide Guile, Pharm.D., BCPS AQ-ID '[]'$  Parks Neptune, Pharm.D., BCPS '[]'$  Alycia Rossetti, Pharm.D., BCPS '[]'$  [place], Pharm.D., BCPS, AAHIVP '[]'$  Legrand Como, Pharm.D., BCPS, AAHIVP '[]'$  Salome Arnt, PharmD, BCPS '[]'$  Johnnette Gourd, PharmD, BCPS '[]'$  Hughes Better, PharmD, BCPS '[]'$  Leeroy Cha, PharmD '[]'$  Laqueta Linden, PharmD, BCPS '[]'$  Albertina Parr, PharmD  [facility] Team '[]'$  Leodis Sias, PharmD '[]'$  Lindell Spar, PharmD '[]'$  Royetta Asal, PharmD '[]'$  Graylin Shiver, Rph '[]'$  Rema Fendt) Glennon Mac, PharmD '[]'$  Arlyn Dunning, PharmD '[]'$  Netta Cedars, PharmD '[]'$  Dia Sitter, PharmD '[]'$  Leone Haven, PharmD '[]'$  Gretta Arab, PharmD '[]'$  Theodis Shove, PharmD '[]'$  Peggyann Juba, PharmD '[]'$  Reuel Boom, PharmD   Positive urine culture Treated with Cefuroxime Axetil , organism sensitive to the same and no further patient follow-up is required at this time.  Glennon Hamilton 06/26/2021, 10:16 AM

## 2021-06-27 ENCOUNTER — Ambulatory Visit (INDEPENDENT_AMBULATORY_CARE_PROVIDER_SITE_OTHER): Payer: Medicare Other | Admitting: Urology

## 2021-06-27 VITALS — BP 122/74 | HR 88 | Ht 67.0 in | Wt 95.0 lb

## 2021-06-27 DIAGNOSIS — R339 Retention of urine, unspecified: Secondary | ICD-10-CM

## 2021-06-27 DIAGNOSIS — N319 Neuromuscular dysfunction of bladder, unspecified: Secondary | ICD-10-CM

## 2021-06-27 DIAGNOSIS — N200 Calculus of kidney: Secondary | ICD-10-CM | POA: Diagnosis not present

## 2021-06-27 MED ORDER — GENTAMICIN SULFATE 40 MG/ML IJ SOLN
160.0000 mg | Freq: Once | INTRAMUSCULAR | Status: AC
  Administered 2021-06-27: 160 mg via INTRAMUSCULAR

## 2021-06-27 MED ORDER — KETOROLAC TROMETHAMINE 60 MG/2ML IM SOLN
15.0000 mg | Freq: Once | INTRAMUSCULAR | Status: AC
  Administered 2021-06-27: 15 mg via INTRAMUSCULAR

## 2021-06-27 MED ORDER — ALFUZOSIN HCL ER 10 MG PO TB24: 10.0000 mg | ORAL_TABLET | Freq: Every day | ORAL | 0 refills | Status: AC

## 2021-06-27 MED ORDER — NITROFURANTOIN MONOHYD MACRO 100 MG PO CAPS: 100.0000 mg | ORAL_CAPSULE | Freq: Two times a day (BID) | ORAL | 0 refills | Status: AC

## 2021-06-27 NOTE — Progress Notes (Signed)
H&P  Chief Complaint: Right-sided kidney stone  History of Present Illness: Jody [name] is a 81 y.o. year old female urgently worked in here today.  I have not been able to receive any medical records until recently.  She has a history of urolithiasis.  Apparently she was treated within the past month or 2 for a left-sided ureteral stone by Dr. Harlow [name] in in [place].  She was recently diagnosed with a right distal ureteral stone.  She has had chronic back pain for years.  She has had weight loss for years.  She has had a neurogenic bladder for years and is relying on self-catheterization sometimes 2-3 times a day to empty her bladder.  Recent CT scan revealed a right distal ureteral stone, 4 mm in size with minimal hydronephrosis.  She was seen both in Dr. Gala [name] office and in the emergency room here at [facility].  There, CT showed:  Nonobstructing renal calculi are noted in the lower pole of the right kidney measuring up to 5 mm. Fullness of the right renal collecting system and right ureter is seen secondary to a distal right ureteral stone which measures approximately 4 mm. This is best visualized on image number 58 of series 2. The bladder is well distended.    Past Medical History:  Diagnosis Date   Back pain    Constipation 12/2017   COPD (chronic obstructive pulmonary disease) (HCC)    Heart murmur    Osteoarthrosis     Past Surgical History:  Procedure Laterality Date   EPIDURAL BLOCK INJECTION     a few ago in [place]     for constipation    Home Medications:  (Not in a hospital admission)   Allergies:  Allergies  Allergen Reactions   Contrast Media [Iodinated Contrast Media] Other (See Comments)    Pain in her chest   Barium-Containing Compounds Other (See Comments)    Patient claims made her throat peel   Cephalosporins Nausea And Vomiting, Nausea Only, Other (See Comments), Rash and Hives   Ciprofloxacin  Hives   Sulfa Antibiotics Hives and Nausea And Vomiting   Brompheniramine    Ceclor [Cefaclor] Hives   Codeine Other (See Comments)    whelps   Dimetapp [Albertsons Di Bromm] Hives   Penicillins Hives and Nausea And Vomiting   Latex Rash   Other Other (See Comments) and Rash    "anything that puts you to sleep" "anything that puts you to sleep"    No family history on file.  Social History:  reports that she has been smoking cigarettes. She has a 0.50 pack-year smoking history. She has never used smokeless tobacco. She reports that she does not drink alcohol and does not use drugs.  ROS: A complete review of systems was performed.  All systems are negative except for pertinent findings as noted.  Physical Exam:  Vital signs in last 24 hours: '@VSRANGES'$ @ General:  Alert and oriented, No acute distress.  Multiple complaints.  Poor historian.  Looks older than stated age. HEENT: Normocephalic, atraumatic Cardiovascular: Regular rate  Lungs: Normal inspiratory/expiratory excursion Extremities: No edema Neurologic: Grossly intact   I have reviewed notes from referring/previous physicians-[facility], emergency room notes  I have reviewed urinalysis results  I have independently reviewed prior imaging-CT scan images reviewed  I have reviewed prior urine culture-E. coli, sensitive to nitrofurantoin  Impression/Assessment:  Mildly obstructing right distal ureteral stone with nonobstructing right renal  stone.  Concurrent urinary tract infection, I think more than likely colonization of her bladder.  I do not think she has pyelonephrosis  Plan:  1.  I will start her on nitrofurantoin.  She was given an injection of gentamicin today  2.  I do think we can try her on medical expulsive therapy.  3.  I will have her come back in 2 to 3 days to check on her progress.  They know to come back if she develops significant fever or worsening of her condition.  Jody [name]  Jody [name] 06/27/2021, 2:02 PM  Jody [name]. Jody Montanari MD

## 2021-06-27 NOTE — Progress Notes (Signed)
Pt is prepped for and in and out catherization. Patient was cleaned and prepped in a sterle fashion with betadine. A 14 fr catheter foley was inserted. Urine return was note 30 ml.  Performed by Levi Aland, CMA  Urine sent for ua    15 mg of '30mg'$  Toradol given, '15mg'$  of Toradol wasted.

## 2021-06-28 ENCOUNTER — Telehealth: Payer: Self-pay

## 2021-06-28 LAB — URINALYSIS, ROUTINE W REFLEX MICROSCOPIC
Bilirubin, UA: NEGATIVE
Glucose, UA: NEGATIVE
Ketones, UA: NEGATIVE
Nitrite, UA: NEGATIVE
Protein,UA: NEGATIVE
RBC, UA: NEGATIVE
Specific Gravity, UA: 1.015 (ref 1.005–1.030)
Urobilinogen, Ur: 0.2 mg/dL (ref 0.2–1.0)
pH, UA: 7 (ref 5.0–7.5)

## 2021-06-28 LAB — MICROSCOPIC EXAMINATION

## 2021-06-28 NOTE — Telephone Encounter (Signed)
Patient called to change appointment from 06-29-21 to 07-04-21 (1st available).  She has medicare coming to her home on day of 06-29-21 appointment to discuss care.  Thanks, Helene Kelp

## 2021-06-29 ENCOUNTER — Ambulatory Visit: Payer: Medicare Other | Admitting: Physician Assistant

## 2021-07-02 NOTE — ED Provider Notes (Signed)
Formatting of this note is different from the original.    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] Chronic idiopathic constipation     Medical Decision Making:      Differential Diagnosis:   UTI, neurogenic bladder, chronic constipation    Social Determinants of Health:  social factors reviewed, did not limit treatment                               Supplemental Historians include:  patient and relative    ED Course:     I discussed with both patient and family member that it is beyond the scope of the emergency department to explore reasons for chronic constipation which have been ongoing for several years and undergoing care from GI specialist as well as diagnostic work-up of neurogenic bladder for which she sees a urologist.  Her son then asked if we can obtain a urine sample to this make sure she does not have a urinary tract infection although she has no specific symptoms suggestive of some.  Given this we will to obtain a cath UA specimen and culture.    UA is very nonspecific for UTI only with small amount of leukocytes, 0-2 red cells, 3-5 white cells and some epithelials.  Patient does not have any dysuria nor signs suggestive of [place] such as fever, abdominal pain or flank pain.  Nausea is chronic.  Will await culture results to treat given multiple drug allergies.  Discussed this with the family and patient and they are agreeable to plan.  Patient also has chronic constipation issues ongoing for several years and is followed by GI.  She uses laxatives.  I recommended stopping laxatives and simply using lots of fluid and fiber supplements and/or MiraLAX which she could use on an 1 or 2 times a day basis.  Given her chronic issues I have given her contact information for urology Dr. Joya Gaskins and GI Dr. Posey Pronto here locally or she can continue to follow with who she normally sees.  Both  family member and patient verbalized understanding and agreement with above plan.    ED Course as of 07/02/21 1439   Sun Jul 02, 2021   1426 Urine Nitrite: Negative [MG]   1426 Urine Leukocyte Esterase(!): Small [MG]   1426 Urine RBC: 0-2 [MG]   1426 Urine WBC: 3-5 [MG]   1426 Urine Bacteria(!): RARE  UA does have small amount of leukocytes but only 0-2 red cells and 3-5 white cells rare bacteria, given lack of symptoms this is unlikely a UTI.  We will hold off treatment due to multiple allergies until culture returns. [MG]         Documentation/Prior Results Review:  Old medical records, Nursing notes  Last seen in this ED 03/04/2021, urine culture at that time grew out both MCL and group B strep sensitive to multiple antibiotic's.  Patient does have multiple drug allergies including pacifically penicillin sulfa and cephalosporin.  Rhythm interpretation from monitor: My interpretation is normal sinus rhythm rate of 93.    Imaging Interpreted by me: Not Applicable    No orders to display     .     Discussion of Mangement with other Physicians, QHP or Appropriate Source:   None    .    The differential  diagnosis and / or critical care lists were considered including infections, sepsis, severe sepsis, and septic shock and found unlikely unless otherwise documented in the final clinical impression or diagnosis list.     Disposition:  Home    Discharge Medication List as of 07/02/2021  2:34 PM       Chief Complaint   Patient presents with    URINARY COMPLAINT     Kimberly Sherman is a 81 y.o. female presents to the emergency department with complaints that she has a neurogenic bladder and that she has been self cathing over the past year and "I want to know why".  She already sees urologist for the same and has had a history of back surgery which she has been told because of neurogenic bladder.  She has been self cathing over the past year has no increased frequency or dysuria, no abdominal pain, flank pain or vomiting,  no fever.  She has chronic nausea which is unchanged.  She also has chronic constipation ongoing for several years has tried multiple medications including laxatives.  She is seeing GI specialist at multiple different locations that have been unable to help her.  As noted above she only complains of chronic nausea but no vomiting, no blood in stools and no change in stooling.      Review of Systems:  Constitutional:  Negative for fever.   HENT:  Negative for trouble swallowing.    Gastrointestinal:  Positive for nausea and constipation. Negative for vomiting, abdominal pain, diarrhea and blood in stool.   Genitourinary:         See HPI self caths, no flank pain dysuria or frequency.   Musculoskeletal:  Negative for back pain.   Neurological:  Negative for weakness.     Physical Exam  Vitals and nursing note reviewed.   Constitutional:       General: She is not in acute distress.     Appearance: She is well-developed. She is not ill-appearing.   HENT:      Head: Normocephalic and atraumatic.      Right Ear: External ear normal.      Left Ear: External ear normal.      Nose: Nose normal.   Eyes:      Conjunctiva/sclera: Conjunctivae normal.      Pupils: Pupils are equal, round, and reactive to light.   Cardiovascular:      Rate and Rhythm: Normal rate and regular rhythm.      Heart sounds: No murmur heard.  Pulmonary:      Effort: Pulmonary effort is normal. No respiratory distress.      Breath sounds: Normal breath sounds. No wheezing or rales.   Abdominal:      General: Bowel sounds are normal. There is no distension.      Palpations: Abdomen is soft.      Tenderness: There is no abdominal tenderness.   Musculoskeletal:         General: No tenderness, deformity or signs of injury. Normal range of motion.      Cervical back: Normal range of motion and neck supple.   Skin:     General: Skin is warm and dry.      Capillary Refill: Capillary refill takes less than 2 seconds.      Findings: No erythema or rash.    Neurological:      Mental Status: She is alert and oriented to person, place, and time.      Cranial  Nerves: No cranial nerve deficit.      Motor: No weakness.   Psychiatric:         Behavior: Behavior normal.      Comments: Flat affect     Past Medical History:   Diagnosis Date    COPD (chronic obstructive pulmonary disease) (HCC)     Heart murmur     Osteoporosis      History reviewed. No pertinent surgical history.  No family history on file.  Social History     Occupational History    Not on file   Tobacco Use    Smoking status: Every Day     Types: Cigarettes    Smokeless tobacco: Never   Vaping Use    Vaping Use: Never used   Substance and Sexual Activity    Alcohol use: Not Currently    Drug use: Not Currently    Sexual activity: Not on file     No outpatient medications have been marked as taking for the 07/02/21 encounter [facility] Encounter).     Allergies   Allergen Reactions    Codeine hives, anaphylaxis/angioedema, other/intolerance and rash/itching     whelps  Other reaction(s): Other (See Comments)  whelps  whelps  Other reaction(s): Other (See Comments)  whelps     Dextromethorphan Hbr hives    Pseudoephedrine Hcl hives    Ciprofloxacin hives    Morphine hives    Iodinated Contrast Media anaphylaxis/angioedema, other/intolerance, neurological reaction and gi distress     Chest pain and pain shooting up arm   Chest pain and pain shooting up arm   Other reaction(s): Dizziness  Chest pain and pain shooting up arm   Chest pain and pain shooting up arm   Chest pain and pain shooting up arm   Pain in her chest  Other reaction(s): Dizziness  Chest pain and pain shooting up arm   Chest pain and pain shooting up arm   Chest pain and pain shooting up arm      Penicillins hives and gi distress     Sick   Sick   Sick   Sick      Sulfa (Sulfonamide Antibiotics) hives and gi distress    Cephalosporins hives, gi distress, other/intolerance and rash/itching    Latex, Natural Rubber rash/itching     Vital  Signs:  Patient Vitals for the past 72 hrs:   Temp Heart Rate Resp BP BP Mean SpO2 Weight   07/02/21 1314 -- -- -- 111/42 65 MM HG -- --   07/02/21 1313 98.8 F (37.1 C) 93 18 -- -- 96 % 40.8 kg (90 lb)     Diagnostics:  Labs:    Results for orders placed or performed during the hospital encounter of 07/02/21   Urinalysis w Micro Reflex Culture    Specimen: Clean Catch Urine   Result Value Ref Range    Source Urine      Urine Color Light Yellow Colorless, Pale Yellow, Light Yellow, Yellow, Dark Yellow, Straw    Urine Clarity Turbid (A) Clear, Slightly Cloudy    Urine pH 7.0 5.0 - 8.0 pH    Urine Protein Screen Negative Negative, Trace mg/dL    Urine Glucose Negative Negative mg/dL    Urine Ketones Negative Negative mg/dL    Urine Occult Blood Negative Negative    Urine Specific Gravity 1.010 1.005 - 1.030    Urine Nitrite Negative Negative    Urine Leukocyte Esterase Small (A) Negative  Urine Bilirubin Negative Negative    Urine Urobilinogen <2.0 <2.0 mg/dL mg/dL    Urine RBC 0-2 Negative, 0-2 /hpf    Urine WBC 3-5 0 - 5 /hpf    Urine Bacteria RARE (A) Negative    Squamous Epithelial Cells 0-2 None, 0-2 /hpf    Amorphous Crystals - Urine PRESENT     Mucus Urine RARE (A) (none)     ECG:  No results found for this visit on 07/02/21.    Medications ordered/given in the ED  Medications - No data to display      Electronically signed by Ann Lions, DO at 07/02/2021  2:39 PM EDT

## 2021-07-02 NOTE — ED Notes (Signed)
Formatting of this note might be different from the original.  Pain assessment on discharge was 0.  Condition stable.  Patient discharged to home.  Patient education was completed:  yes  Education taught to:  patient  Teaching method used was discussion.  Understanding of teaching was good.  Patient was discharged ambulatory.  Discharged with family.  Valuables were given to: patient.   Electronically signed by Elzie Rings, RN at 07/02/2021  2:37 PM EDT

## 2021-07-02 NOTE — ED Triage Notes (Signed)
Formatting of this note might be different from the original.  Patient presents to the ED alert with the c/o hurting in her perineal area for over 1 year. Patient states that she self caths daily. Pain 10/10.  Electronically signed by Fabiola Backer, RN at 07/02/2021  1:13 PM EDT

## 2021-07-04 ENCOUNTER — Ambulatory Visit (INDEPENDENT_AMBULATORY_CARE_PROVIDER_SITE_OTHER): Payer: Medicare Other | Admitting: Urology

## 2021-07-04 ENCOUNTER — Encounter: Payer: Self-pay | Admitting: Urology

## 2021-07-04 VITALS — BP 131/69 | HR 81 | Ht 67.0 in | Wt 95.0 lb

## 2021-07-04 DIAGNOSIS — N200 Calculus of kidney: Secondary | ICD-10-CM | POA: Diagnosis not present

## 2021-07-04 DIAGNOSIS — N319 Neuromuscular dysfunction of bladder, unspecified: Secondary | ICD-10-CM | POA: Diagnosis not present

## 2021-07-04 LAB — URINALYSIS, ROUTINE W REFLEX MICROSCOPIC
Bilirubin, UA: NEGATIVE
Glucose, UA: NEGATIVE
Ketones, UA: NEGATIVE
Nitrite, UA: NEGATIVE
RBC, UA: NEGATIVE
Specific Gravity, UA: 1.02 (ref 1.005–1.030)
Urobilinogen, Ur: 0.2 mg/dL (ref 0.2–1.0)
pH, UA: 6.5 (ref 5.0–7.5)

## 2021-07-04 LAB — MICROSCOPIC EXAMINATION: RBC, Urine: NONE SEEN /hpf (ref 0–2)

## 2021-07-04 NOTE — Progress Notes (Signed)
Pt is prepped for and in and out catherization. Patient was cleaned and prepped in a sterle fashion with betadine. A 14 fr catheter foley was inserted. Urine return was note 40 ml.  Performed by Charleston Poot  Urine sent for ua

## 2021-07-04 NOTE — Progress Notes (Signed)
History of Present Illness: Jody [name] is a 81 y.o. year old female here for follow-up of right ureteral calculus.  She has a history of recurrent urolithiasis.  Initial appointment last week following emergency room visit.  CT at that time for lower abdominal pain revealed a 4 mm right distal ureteral stone with hydronephrosis.  Her urine appeared infected but she is not having fever.  She is on long-term self-catheterization for neurogenic bladder.  Culture grew E. coli, covered by the Macrobid that she was sent home on.  She is here today for follow-up.  She has persistent lower abdominal pain that from what I can tell is long-term laded to kidney stone.  She has had no flank pain.  She is fairly obsessed with having intestinal obstruction although CT scan just showed a moderate amount of intestinal gas.  Since her visit last year she has had no fever.  She is on self-catheterization 3-4 times a day.  Past Medical History:  Diagnosis Date   Back pain    Constipation 12/2017   COPD (chronic obstructive pulmonary disease) (HCC)    Heart murmur    Osteoarthrosis     Past Surgical History:  Procedure Laterality Date   EPIDURAL BLOCK INJECTION     a few ago in [place]     for constipation    Home Medications:  (Not in a hospital admission)   Allergies:  Allergies  Allergen Reactions   Contrast Media [Iodinated Contrast Media] Other (See Comments)    Pain in her chest   Barium-Containing Compounds Other (See Comments)    Patient claims made her throat peel   Cephalosporins Nausea And Vomiting, Nausea Only, Other (See Comments), Rash and Hives   Ciprofloxacin Hives   Sulfa Antibiotics Hives and Nausea And Vomiting   Brompheniramine    Ceclor [Cefaclor] Hives   Codeine Other (See Comments)    whelps   Dimetapp [[employer] Di Bromm] Hives   Penicillins Hives and Nausea And Vomiting   Latex Rash   Other Other (See Comments) and  Rash    "anything that puts you to sleep" "anything that puts you to sleep"    No family history on file.  Social History:  reports that she has been smoking cigarettes. She has a 0.50 pack-year smoking history. She has never used smokeless tobacco. She reports that she does not drink alcohol and does not use drugs.  ROS: A complete review of systems was performed.  All systems are negative except for pertinent findings as noted.  Physical Exam:  Vital signs in last 24 hours: '@VSRANGES'$ @ General:  Alert and oriented, No acute distress.  She is cachectic. HEENT: Normocephalic, atraumatic Neck: No JVD or lymphadenopathy Cardiovascular: Regular rate  Lungs: Normal inspiratory/expiratory excursion Abdomen: Soft, nontender, nondistended, no abdominal masses Back: No CVA tenderness Extremities: No edema Neurologic: Grossly intact  I have reviewed prior pt notes  I have reviewed urinalysis and urine culture results  I have independently reviewed prior imaging to review CT images with them  I have reviewed prior urine culture   Impression/Assessment:  Last week.  She was sent home with expulsive therapy.  Unable to see the stone on scout film from the CT scan  Plan:  1.  I will send urine for culture  2.  I will send her back for CT of the pelvis we will call with results and follow-up.  If stone still present  [name] Redonna Wilbert 07/04/2021, 12:21 PM  [name]. Sylvie Mifsud MD

## 2021-07-06 ENCOUNTER — Ambulatory Visit (INDEPENDENT_AMBULATORY_CARE_PROVIDER_SITE_OTHER): Payer: Medicare Other | Admitting: Physician Assistant

## 2021-07-06 VITALS — BP 137/65 | HR 73 | Temp 97.9°F | Ht 67.0 in | Wt 95.0 lb

## 2021-07-06 DIAGNOSIS — N952 Postmenopausal atrophic vaginitis: Secondary | ICD-10-CM | POA: Diagnosis not present

## 2021-07-06 DIAGNOSIS — R339 Retention of urine, unspecified: Secondary | ICD-10-CM

## 2021-07-06 DIAGNOSIS — N200 Calculus of kidney: Secondary | ICD-10-CM

## 2021-07-06 DIAGNOSIS — J449 Chronic obstructive pulmonary disease, unspecified: Secondary | ICD-10-CM | POA: Diagnosis not present

## 2021-07-06 DIAGNOSIS — K5900 Constipation, unspecified: Secondary | ICD-10-CM

## 2021-07-06 DIAGNOSIS — N319 Neuromuscular dysfunction of bladder, unspecified: Secondary | ICD-10-CM

## 2021-07-06 LAB — BLADDER SCAN AMB NON-IMAGING: Scan Result: 66

## 2021-07-06 MED ORDER — ESTRADIOL 0.1 MG/GM VA CREA: TOPICAL_CREAM | VAGINAL | 12 refills | Status: AC

## 2021-07-06 NOTE — Progress Notes (Unsigned)
post void residual =66mL 

## 2021-07-06 NOTE — Progress Notes (Incomplete)
Assessment: There are no diagnoses linked to this encounter.   Plan: Discussed concerns of decrease in urine output and dehydration with possible impaired renal function. Explained   Chief Complaint: No chief complaint on file.   HPI: Jody [name] is a 81 y.o. female who presents for continued evaluation of ***.   Last void 6pm last night.  PVR 59m  In and out cath attempt for urine unsuccessful Portions of the above documentation were copied from a prior visit for review purposes only.  Allergies: Allergies  Allergen Reactions   Contrast Media [Iodinated Contrast Media] Other (See Comments)    Pain in her chest   Barium-Containing Compounds Other (See Comments)    Patient claims made her throat peel   Cephalosporins Nausea And Vomiting, Nausea Only, Other (See Comments), Rash and Hives   Ciprofloxacin Hives   Sulfa Antibiotics Hives and Nausea And Vomiting   Brompheniramine    Ceclor [Cefaclor] Hives   Codeine Other (See Comments)    whelps   Dimetapp [Albertsons Di Bromm] Hives   Penicillins Hives and Nausea And Vomiting   Latex Rash   Other Other (See Comments) and Rash    "anything that puts you to sleep" "anything that puts you to sleep"    PMH: Past Medical History:  Diagnosis Date   Back pain    Constipation 12/2017   COPD (chronic obstructive pulmonary disease) (HCC)    Heart murmur    Osteoarthrosis     PSH: Past Surgical History:  Procedure Laterality Date   [place]    SH: Social History   Tobacco Use   Smoking status: Every Day    Packs/day: 0.50    Years: 1.00    Total pack years: 0.50    Types: Cigarettes   Smokeless tobacco: Never   Tobacco comments:    1/2 pack a day since age 81  Vaping Use   Vaping Use: Never used  Substance Use Topics   Alcohol use: No   Drug use: No    ROS: All other review of systems were  reviewed and are negative except what is noted above in HPI  PE: BP 137/65   Pulse 73   Temp 97.9 F (36.6 C)   Ht '5\' 7"'$  (1.702 m)   Wt 95 lb (43.1 kg)   BMI 14.88 kg/m  GENERAL APPEARANCE:  Well appearing, well developed, well nourished, NAD HEENT:  Atraumatic, normocephalic NECK:  Supple. Trachea midline ABDOMEN:  Soft, non-tender, no masses EXTREMITIES:  Moves all extremities well, without clubbing, cyanosis, or edema NEUROLOGIC:  Alert and oriented x 3, normal gait, CN II-XII grossly intact MENTAL STATUS:  appropriate BACK:  Non-tender to palpation, No CVAT SKIN:  Warm, dry, and intact   Results: Laboratory Data: Lab Results  Component Value Date   WBC 5.5 06/22/2021   HGB 10.7 (L) 06/22/2021   HCT 34.4 (L) 06/22/2021   MCV 95.8 06/22/2021   PLT 197 06/22/2021    Lab Results  Component Value Date   CREATININE 0.94 06/22/2021    No results found for: "PSA"  No results found for: "TESTOSTERONE"  No results found for: "HGBA1C"  Urinalysis    Component Value Date/Time   COLORURINE YELLOW 06/22/2021 1813   APPEARANCEUR Cloudy (A) 07/04/2021 1204   LABSPEC 1.017 06/22/2021 1813   PHURINE 5.0 06/22/2021  [place] Negative 07/04/2021 [place] 06/22/2021 1813   BILIRUBINUR Negative 07/04/2021 [place] 06/22/2021 1813   PROTEINUR 1+ (A) 07/04/2021 1204   PROTEINUR 30 (A) 06/22/2021 1813   NITRITE Negative 07/04/2021 1204   NITRITE POSITIVE (A) 06/22/2021 1813   LEUKOCYTESUR 1+ (A) 07/04/2021 1204   LEUKOCYTESUR SMALL (A) 06/22/2021 1813    Lab Results  Component Value Date   LABMICR See below: 07/04/2021   WBCUA 6-10 (A) 07/04/2021   LABEPIT 0-10 07/04/2021   BACTERIA Moderate (A) 07/04/2021    Pertinent Imaging: No results found for this or any previous visit.  No results found for this or any previous visit.  No results found for this or any previous visit.  No results found for this or any previous  visit.  Results for orders placed during the hospital encounter of 04/27/08  US Renal  Narrative Clinical Data: Difficulty voiding, recurrent urinary tract infections  RENAL/URINARY TRACT ULTRASOUND COMPLETE  Comparison: None  Findings:  Comparison: None  Findings:  Right Kidney = 10.4 cm.  No evidence of hydronephrosis, cyst, mass, or stone.  Left kidney = 11.0 cm.  No evidence of hydronephrosis, cyst, mass, or stone.  Bladder:  Bladder distended.  No irregularity identified. Postvoid residual equals 150 ml  IMPRESSION:  1.  Normal renal ultrasound. 2.  Small postvoid residual.  Provider: Eldridge Scot  No results found for this or any previous visit.  No results found for this or any previous visit.  No results found for this or any previous visit.  No results found for this or any previous visit (from the past 24 hour(s)).

## 2021-07-08 ENCOUNTER — Emergency Department (HOSPITAL_COMMUNITY): Payer: Medicare Other

## 2021-07-08 ENCOUNTER — Other Ambulatory Visit: Payer: Self-pay

## 2021-07-08 ENCOUNTER — Encounter (HOSPITAL_COMMUNITY): Payer: Self-pay | Admitting: Emergency Medicine

## 2021-07-08 ENCOUNTER — Emergency Department (HOSPITAL_COMMUNITY)
Admission: EM | Admit: 2021-07-08 | Discharge: 2021-07-08 | Disposition: A | Discharge status: Home / Self Care | Payer: Medicare Other | Attending: Emergency Medicine | Admitting: Emergency Medicine

## 2021-07-08 DIAGNOSIS — J449 Chronic obstructive pulmonary disease, unspecified: Secondary | ICD-10-CM | POA: Diagnosis not present

## 2021-07-08 DIAGNOSIS — Z720 Tobacco use: Secondary | ICD-10-CM | POA: Insufficient documentation

## 2021-07-08 DIAGNOSIS — N200 Calculus of kidney: Secondary | ICD-10-CM

## 2021-07-08 DIAGNOSIS — D649 Anemia, unspecified: Secondary | ICD-10-CM | POA: Insufficient documentation

## 2021-07-08 DIAGNOSIS — N132 Hydronephrosis with renal and ureteral calculous obstruction: Secondary | ICD-10-CM | POA: Diagnosis not present

## 2021-07-08 DIAGNOSIS — Z91040 Latex allergy status: Secondary | ICD-10-CM | POA: Diagnosis not present

## 2021-07-08 DIAGNOSIS — R1032 Left lower quadrant pain: Secondary | ICD-10-CM | POA: Diagnosis present

## 2021-07-08 LAB — CBC WITH DIFFERENTIAL/PLATELET
Abs Immature Granulocytes: 0.01 10*3/uL (ref 0.00–0.07)
Basophils Absolute: 0 10*3/uL (ref 0.0–0.1)
Basophils Relative: 1 %
Eosinophils Absolute: 0.1 10*3/uL (ref 0.0–0.5)
Eosinophils Relative: 1 %
HCT: 34.8 % — ABNORMAL LOW (ref 36.0–46.0)
Hemoglobin: 11.1 g/dL — ABNORMAL LOW (ref 12.0–15.0)
Immature Granulocytes: 0 %
Lymphocytes Relative: 30 %
Lymphs Abs: 1.3 10*3/uL (ref 0.7–4.0)
MCH: 30.1 pg (ref 26.0–34.0)
MCHC: 31.9 g/dL (ref 30.0–36.0)
MCV: 94.3 fL (ref 80.0–100.0)
Monocytes Absolute: 0.4 10*3/uL (ref 0.1–1.0)
Monocytes Relative: 9 %
Neutro Abs: 2.5 10*3/uL (ref 1.7–7.7)
Neutrophils Relative %: 59 %
Platelets: 199 10*3/uL (ref 150–400)
RBC: 3.69 MIL/uL — ABNORMAL LOW (ref 3.87–5.11)
RDW: 13.7 % (ref 11.5–15.5)
WBC: 4.2 10*3/uL (ref 4.0–10.5)
nRBC: 0 % (ref 0.0–0.2)

## 2021-07-08 LAB — URINALYSIS, ROUTINE W REFLEX MICROSCOPIC
Bacteria, UA: NONE SEEN
Bilirubin Urine: NEGATIVE
Glucose, UA: NEGATIVE mg/dL
Hgb urine dipstick: NEGATIVE
Ketones, ur: NEGATIVE mg/dL
Nitrite: NEGATIVE
Protein, ur: 30 mg/dL — AB
Specific Gravity, Urine: 1.015 (ref 1.005–1.030)
pH: 5 (ref 5.0–8.0)

## 2021-07-08 LAB — COMPREHENSIVE METABOLIC PANEL
ALT: 14 U/L (ref 0–44)
AST: 20 U/L (ref 15–41)
Albumin: 3.3 g/dL — ABNORMAL LOW (ref 3.5–5.0)
Alkaline Phosphatase: 173 U/L — ABNORMAL HIGH (ref 38–126)
Anion gap: 4 — ABNORMAL LOW (ref 5–15)
BUN: 23 mg/dL (ref 8–23)
CO2: 27 mmol/L (ref 22–32)
Calcium: 8.8 mg/dL — ABNORMAL LOW (ref 8.9–10.3)
Chloride: 108 mmol/L (ref 98–111)
Creatinine, Ser: 0.82 mg/dL (ref 0.44–1.00)
GFR, Estimated: >60 mL/min (ref >60)
Glucose, Bld: 91 mg/dL (ref 70–99)
Potassium: 3.5 mmol/L (ref 3.5–5.1)
Sodium: 139 mmol/L (ref 135–145)
Total Bilirubin: 0.3 mg/dL (ref 0.3–1.2)
Total Protein: 6.6 g/dL (ref 6.5–8.1)

## 2021-07-08 LAB — LIPASE, BLOOD: Lipase: 35 U/L (ref 11–51)

## 2021-07-08 MED ORDER — HYDROMORPHONE HCL 1 MG/ML IJ SOLN
1.0000 mg | Freq: Once | INTRAMUSCULAR | Status: AC
  Administered 2021-07-08: 1 mg via INTRAVENOUS
  Filled 2021-07-08: qty 1

## 2021-07-08 MED ORDER — SODIUM CHLORIDE 0.9 % IV BOLUS
1000.0000 mL | Freq: Once | INTRAVENOUS | Status: AC
Start: 2021-07-08 — End: 2021-07-08
  Administered 2021-07-08: 1000 mL via INTRAVENOUS

## 2021-07-08 NOTE — ED Provider Notes (Signed)
[facility] Provider Note   CSN: [id number] Arrival date & time: 07/08/21  0944     History  Chief Complaint  Patient presents with   Abdominal Pain    Jody [name] is a 81 y.o. female.   Abdominal Pain   Patient with medical history of COPD, tobacco use disorder, neurogenic bladder, constipation, atrophic vaginitis, frequent renal stones presents today due to left flank pain/left lower quadrant pain.  states she has been having this left lower quadrant pain for over a year and "nobody will do anything about it".  She thinks it is a kidney stone stuck in that area, says she was seen by her urologist last week told her to go to the ED for possible dehydration.  Patient states she drinks "plenty of water" drinks 1 to 2 cups a day.  She denies any nausea or vomiting, states she only takes ibuprofen for pain and that has not been helping.  She self caths 2-3 times a day, endorses decreased output.  Not having any fevers, chills, vaginal bleeding, rectal bleeding, vomiting, nausea.  Home Medications Prior to Admission medications   Medication Sig Start Date End Date Taking? Authorizing Provider  alfuzosin (UROXATRAL) 10 MG 24 hr tablet Take 1 tablet (10 mg total) by mouth daily with breakfast. 06/27/21   Franchot Gallo, MD  cefUROXime (CEFTIN) 500 MG tablet Take 1 tablet (500 mg total) by mouth 2 (two) times daily with a meal. 06/23/21   Davonna Belling, MD  Cholecalciferol (VITAMIN D) 50 MCG (2000 UT) tablet Take 2,000 Units by mouth daily.    [provider]  docusate sodium (COLACE) 100 MG capsule Take 1 capsule (100 mg total) by mouth every 12 (twelve) hours. 06/23/21   Davonna Belling, MD  estradiol (ESTRACE) 0.1 MG/GM vaginal cream Apply a pea size amount of cream to urethral area of vagina twice weekly 07/06/21   Summerlin, Cristal Ford Annette, PA-C  ondansetron (ZOFRAN) 4 MG tablet Take 1 tablet (4 mg total) by mouth every 6 (six) hours. 06/23/21    Davonna Belling, MD  ondansetron (ZOFRAN-ODT) 4 MG disintegrating tablet '4mg'$  ODT q4 hours prn nausea/vomit 06/23/21   Mesner, Corene Cornea, MD  oxyCODONE-acetaminophen (PERCOCET/ROXICET) 5-325 MG tablet Take 1 tablet by mouth every 6 (six) hours as needed for up to 12 doses for severe pain. 06/20/21   Luna Fuse, MD  polyethylene glycol [facility]) packet Take 17 g by mouth daily. 12/21/17   Carroll Jordyne Poehlman, MD  potassium chloride SA (KLOR-CON M) 20 MEQ tablet Take 2 tablets (40 mEq total) by mouth 2 (two) times daily. 06/23/21   Davonna Belling, MD      Allergies    Contrast media [iodinated contrast media], Barium-containing compounds, Cephalosporins, Ciprofloxacin, Sulfa antibiotics, Brompheniramine, Ceclor [cefaclor], Codeine, Dimetapp [albertsons di bromm], Penicillins, Latex, and Other    Review of Systems   Review of Systems  Gastrointestinal:  Positive for abdominal pain.    Physical Exam Updated Vital Signs BP 127/63   Pulse 83   Temp 98.4 F (36.9 C) (Oral)   Resp 16   SpO2 100%  Physical Exam Vitals and nursing note reviewed. Exam conducted with a chaperone present.  Constitutional:      Appearance: Normal appearance.     Comments: Frail  HENT:     Head: Normocephalic and atraumatic.  Eyes:     General: No scleral icterus.       Right eye: No discharge.  Left eye: No discharge.     Extraocular Movements: Extraocular movements intact.     Pupils: Pupils are equal, round, and reactive to light.  Cardiovascular:     Rate and Rhythm: Normal rate and regular rhythm.     Pulses: Normal pulses.     Heart sounds: Normal heart sounds. No murmur heard.    No friction rub. No gallop.  Pulmonary:     Effort: Pulmonary effort is normal. No respiratory distress.     Breath sounds: Normal breath sounds.  Abdominal:     General: Abdomen is flat. Bowel sounds are normal. There is no distension.     Palpations: Abdomen is soft.     Tenderness: There is abdominal tenderness in  the left lower quadrant. There is left CVA tenderness.  Skin:    General: Skin is warm and dry.     Coloration: Skin is not jaundiced.  Neurological:     Mental Status: She is alert. Mental status is at baseline.     Coordination: Coordination normal.     ED Results / Procedures / Treatments   Labs (all labs ordered are listed, but only abnormal results are displayed) Labs Reviewed  CBC WITH DIFFERENTIAL/PLATELET - Abnormal; Notable for the following components:      Result Value   RBC 3.69 (*)    Hemoglobin 11.1 (*)    HCT 34.8 (*)    All other components within normal limits  COMPREHENSIVE METABOLIC PANEL - Abnormal; Notable for the following components:   Calcium 8.8 (*)    Albumin 3.3 (*)    Alkaline Phosphatase 173 (*)    Anion gap 4 (*)    All other components within normal limits  URINALYSIS, ROUTINE W REFLEX MICROSCOPIC - Abnormal; Notable for the following components:   Protein, ur 30 (*)    Leukocytes,Ua SMALL (*)    All other components within normal limits  URINE CULTURE  LIPASE, BLOOD    EKG None  Radiology CT ABDOMEN PELVIS WO CONTRAST  Result Date: 07/08/2021 CLINICAL DATA:  Left flank pain.  Nephrolithiasis. EXAM: CT ABDOMEN AND PELVIS WITHOUT CONTRAST TECHNIQUE: Multidetector CT imaging of the abdomen and pelvis was performed following the standard protocol without IV contrast. RADIATION DOSE REDUCTION: This exam was performed according to the departmental dose-optimization program which includes automated exposure control, adjustment of the mA and/or kV according to patient size and/or use of iterative reconstruction technique. COMPARISON:  06/23/2021 FINDINGS: Lower chest: No acute findings. Hepatobiliary: A few small cysts are again noted in the left hepatic lobe. No mass visualized on this unenhanced exam. Gallbladder is unremarkable. No evidence of biliary ductal dilatation. Pancreas: No mass or inflammatory process visualized on this unenhanced exam.  Spleen:  Within normal limits in size. Adrenals/Urinary tract: Several small less than 1 cm right renal calculi are again seen. 2 mm calculus is again seen in the distal right ureter, however mild right hydroureteronephrosis is decreased since previous study. No left-sided urinary calculi identified. Unremarkable unopacified urinary bladder. Stomach/Bowel: Stable postop changes from subtotal colectomy. No evidence of obstruction, inflammatory process, or abnormal fluid collections. Vascular/Lymphatic: No pathologically enlarged lymph nodes identified. No evidence of abdominal aortic aneurysm. Aortic atherosclerotic calcification incidentally noted. Reproductive:  No mass or other significant abnormality. Other:  None. Musculoskeletal: No suspicious bone lesions identified. A few old bilateral rib fracture deformities are again seen. Old vertebral body compression deformities are again seen involving the T12, L1, L3, L4, and L5 vertebral bodies. Prior vertebroplasty noted  at L3. IMPRESSION: No acute findings. Stable 2 mm distal right ureteral calculus. Decreased mild right hydroureteronephrosis since prior study. Right nephrolithiasis. Old bilateral rib and thoracolumbar spine fractures. Electronically Signed   By: Marlaine Hind M.D.   On: 07/08/2021 11:23    Procedures Procedures    Medications Ordered in ED Medications  sodium chloride 0.9 % bolus 1,000 mL (0 mLs Intravenous Stopped 07/08/21 1448)  HYDROmorphone (DILAUDID) injection 1 mg (1 mg Intravenous Given 07/08/21 1054)    ED Course/ Medical Decision Making/ A&P Clinical Course as of 07/08/21 1916  Sat Jul 08, 2021  1232 CT ABDOMEN PELVIS WO CONTRAST [HS]    Clinical Course User Index [HS] Sherrill Raring, PA-C                           Medical Decision Making Amount and/or Complexity of Data Reviewed Labs: ordered. Radiology: ordered. Decision-making details documented in ED Course.  Risk Prescription drug management.   Patient  presents due to abdominal pain.  Differential is broad but includes nephrolithiasis, diverticulitis, MSK, AKI, dehydration.  Reviewed external records including urology note.  Also reviewed previous imaging where patient has right-sided nephrolithiasis and previous scans.  Patient does not have any peritoneal signs on exam although she does have left lower quadrant tenderness and left CVA tenderness.  Vitals are stable, no signs of sepsis.  BP 127/63   Pulse 83   Temp 98.4 F (36.9 C) (Oral)   Resp 16   SpO2 100%   I reviewed laboratory work-up.  There is no leukocytosis, there is stable anemia with hemoglobin 11.1.  No gross electrolyte derangement or AKI.  Lipase is within normal limits.  UA shows small leukocytes and calcium oxalate which suggestive of kidney stone but I do not see signs suggesting infected stone or septic stone.  I ordered and reviewed the CT scan of her abdomen.  No diverticulitis, she does have but appears to be a 2 mm right kidney stone.  Its in the ureter, dissection improved hydronephrosis.  Patient encouraged to continue taking her pain medicine.  I do not think she needs any antibiotics for infection, I encouraged her to follow-up with urology.  Not sure why she is having pain on the left side but based on imaging and work-up today I do not think it is an acute or emergent abdominal process.  Discussed results with the patient, she verbalized understanding.  I could give her referral for secondary urologist at her request.  Patient is going to call her primary urologist on Monday plan for reevaluation.        Final Clinical Impression(s) / ED Diagnoses Final diagnoses:  Nephrolithiasis    Rx / DC Orders ED Discharge Orders     None         Sherrill Raring, Hershal Coria 07/08/21 1916    Fredia Sorrow, MD 07/15/21 [phone]

## 2021-07-08 NOTE — ED Notes (Signed)
Bladder Scanner revealed 40mL of urine in bladder

## 2021-07-10 LAB — URINE CULTURE: Culture: NO GROWTH

## 2021-07-11 ENCOUNTER — Ambulatory Visit: Payer: Medicare Other | Admitting: Physician Assistant

## 2021-07-11 ENCOUNTER — Encounter: Payer: Self-pay | Admitting: Urology

## 2021-07-11 ENCOUNTER — Ambulatory Visit (INDEPENDENT_AMBULATORY_CARE_PROVIDER_SITE_OTHER): Payer: Medicare Other | Admitting: Urology

## 2021-07-11 VITALS — BP 111/65 | HR 84

## 2021-07-11 DIAGNOSIS — R339 Retention of urine, unspecified: Secondary | ICD-10-CM | POA: Diagnosis not present

## 2021-07-11 DIAGNOSIS — N200 Calculus of kidney: Secondary | ICD-10-CM | POA: Diagnosis not present

## 2021-07-11 NOTE — Progress Notes (Signed)
Pt is prepped for and in and out catherization. Patient was cleaned and prepped in a sterle fashion with betadine. A 14 fr catheter foley was inserted. Urine return was note 70 ml.  Performed by Levi Aland, CMA  Urine sent for culture.

## 2021-07-12 ENCOUNTER — Telehealth: Payer: Self-pay

## 2021-07-12 ENCOUNTER — Ambulatory Visit (HOSPITAL_COMMUNITY): Payer: Medicare Other

## 2021-07-12 ENCOUNTER — Ambulatory Visit: Payer: Medicare Other | Admitting: Family Medicine

## 2021-07-12 NOTE — Telephone Encounter (Signed)
Patient called states she is in extreme pain from the kidney stone and wants to know if there is anything that can be sent in?

## 2021-07-13 ENCOUNTER — Telehealth: Payer: Self-pay

## 2021-07-13 NOTE — Telephone Encounter (Signed)
Patient called advising that she fell and is unable to currently move and she wont be able to proceed with surgery at this time.

## 2021-07-13 NOTE — Telephone Encounter (Signed)
I returned patient call= patient reports she fell and said "my spine is crushed"  Patient cancelled surgery-   Hoyle Sauer, please pull patient off the OR board.

## 2021-07-14 ENCOUNTER — Encounter (HOSPITAL_COMMUNITY): Payer: Self-pay

## 2021-07-14 ENCOUNTER — Emergency Department (HOSPITAL_COMMUNITY): Payer: Medicare Other

## 2021-07-14 ENCOUNTER — Emergency Department (HOSPITAL_COMMUNITY)
Admission: EM | Admit: 2021-07-14 | Discharge: 2021-07-14 | Disposition: A | Discharge status: Home / Self Care | Payer: Medicare Other | Attending: Emergency Medicine | Admitting: Emergency Medicine

## 2021-07-14 ENCOUNTER — Other Ambulatory Visit: Payer: Self-pay

## 2021-07-14 DIAGNOSIS — E876 Hypokalemia: Secondary | ICD-10-CM | POA: Insufficient documentation

## 2021-07-14 DIAGNOSIS — K5900 Constipation, unspecified: Secondary | ICD-10-CM | POA: Diagnosis not present

## 2021-07-14 DIAGNOSIS — J449 Chronic obstructive pulmonary disease, unspecified: Secondary | ICD-10-CM | POA: Diagnosis not present

## 2021-07-14 DIAGNOSIS — Z91040 Latex allergy status: Secondary | ICD-10-CM | POA: Diagnosis not present

## 2021-07-14 DIAGNOSIS — R32 Unspecified urinary incontinence: Secondary | ICD-10-CM | POA: Insufficient documentation

## 2021-07-14 DIAGNOSIS — M79671 Pain in right foot: Secondary | ICD-10-CM | POA: Insufficient documentation

## 2021-07-14 DIAGNOSIS — R1032 Left lower quadrant pain: Secondary | ICD-10-CM | POA: Diagnosis present

## 2021-07-14 DIAGNOSIS — M545 Low back pain, unspecified: Secondary | ICD-10-CM | POA: Insufficient documentation

## 2021-07-14 DIAGNOSIS — N390 Urinary tract infection, site not specified: Secondary | ICD-10-CM

## 2021-07-14 LAB — URINALYSIS, ROUTINE W REFLEX MICROSCOPIC
Bilirubin Urine: NEGATIVE
Glucose, UA: NEGATIVE mg/dL
Ketones, ur: NEGATIVE mg/dL
Nitrite: POSITIVE — AB
Protein, ur: 30 mg/dL — AB
Specific Gravity, Urine: 1.01 (ref 1.005–1.030)
WBC, UA: >50 WBC/hpf — ABNORMAL HIGH (ref 0–5)
pH: 6 (ref 5.0–8.0)

## 2021-07-14 LAB — CBC WITH DIFFERENTIAL/PLATELET
Abs Immature Granulocytes: 0.03 10*3/uL (ref 0.00–0.07)
Basophils Absolute: 0 10*3/uL (ref 0.0–0.1)
Basophils Relative: 0 %
Eosinophils Absolute: 0.1 10*3/uL (ref 0.0–0.5)
Eosinophils Relative: 1 %
HCT: 37.9 % (ref 36.0–46.0)
Hemoglobin: 12.1 g/dL (ref 12.0–15.0)
Immature Granulocytes: 0 %
Lymphocytes Relative: 12 %
Lymphs Abs: 1.1 10*3/uL (ref 0.7–4.0)
MCH: 29.8 pg (ref 26.0–34.0)
MCHC: 31.9 g/dL (ref 30.0–36.0)
MCV: 93.3 fL (ref 80.0–100.0)
Monocytes Absolute: 0.6 10*3/uL (ref 0.1–1.0)
Monocytes Relative: 6 %
Neutro Abs: 7.3 10*3/uL (ref 1.7–7.7)
Neutrophils Relative %: 81 %
Platelets: 162 10*3/uL (ref 150–400)
RBC: 4.06 MIL/uL (ref 3.87–5.11)
RDW: 13.6 % (ref 11.5–15.5)
WBC: 9.1 10*3/uL (ref 4.0–10.5)
nRBC: 0 % (ref 0.0–0.2)

## 2021-07-14 LAB — BASIC METABOLIC PANEL
Anion gap: 7 (ref 5–15)
BUN: 20 mg/dL (ref 8–23)
CO2: 28 mmol/L (ref 22–32)
Calcium: 9 mg/dL (ref 8.9–10.3)
Chloride: 104 mmol/L (ref 98–111)
Creatinine, Ser: 0.8 mg/dL (ref 0.44–1.00)
GFR, Estimated: >60 mL/min (ref >60)
Glucose, Bld: 117 mg/dL — ABNORMAL HIGH (ref 70–99)
Potassium: 3 mmol/L — ABNORMAL LOW (ref 3.5–5.1)
Sodium: 139 mmol/L (ref 135–145)

## 2021-07-14 MED ORDER — BISACODYL 5 MG PO TBEC
5.0000 mg | DELAYED_RELEASE_TABLET | Freq: Once | ORAL | Status: AC
  Administered 2021-07-14: 5 mg via ORAL
  Filled 2021-07-14: qty 1

## 2021-07-14 MED ORDER — POLYETHYLENE GLYCOL 3350 17 G PO PACK: 17.0000 g | PACK | Freq: Every day | ORAL | 0 refills | Status: AC

## 2021-07-14 MED ORDER — MORPHINE SULFATE (PF) 2 MG/ML IV SOLN
2.0000 mg | Freq: Once | INTRAVENOUS | Status: AC
  Administered 2021-07-14: 2 mg via INTRAVENOUS
  Filled 2021-07-14: qty 1

## 2021-07-14 MED ORDER — NITROFURANTOIN MONOHYD MACRO 100 MG PO CAPS
100.0000 mg | ORAL_CAPSULE | Freq: Once | ORAL | Status: AC
  Administered 2021-07-14: 100 mg via ORAL
  Filled 2021-07-14: qty 1

## 2021-07-14 MED ORDER — POTASSIUM CHLORIDE CRYS ER 20 MEQ PO TBCR
40.0000 meq | EXTENDED_RELEASE_TABLET | Freq: Once | ORAL | Status: AC
  Administered 2021-07-14: 40 meq via ORAL
  Filled 2021-07-14: qty 2

## 2021-07-14 MED ORDER — FLEET ENEMA 7-19 GM/118ML RE ENEM
1.0000 | ENEMA | Freq: Once | RECTAL | Status: AC
  Administered 2021-07-14: 1 via RECTAL

## 2021-07-14 MED ORDER — ONDANSETRON HCL 4 MG/2ML IJ SOLN
4.0000 mg | Freq: Once | INTRAMUSCULAR | Status: AC
  Administered 2021-07-14: 4 mg via INTRAVENOUS
  Filled 2021-07-14: qty 2

## 2021-07-14 MED ORDER — NITROFURANTOIN MONOHYD MACRO 100 MG PO CAPS: 100.0000 mg | ORAL_CAPSULE | Freq: Two times a day (BID) | ORAL | 0 refills | Status: DC

## 2021-07-14 NOTE — ED Notes (Signed)
ED Provider at bedside. 

## 2021-07-14 NOTE — ED Triage Notes (Signed)
Pt presents to ED with complaints of lower back and and right foot swelling.

## 2021-07-14 NOTE — ED Provider Notes (Signed)
[facility] EMERGENCY DEPARTMENT Provider Note   CSN: [id number] Arrival date & time: 07/14/21  1554     History  Chief Complaint  Patient presents with   Back Pain    LOLETTA HARPER is a 81 y.o. female with a history of COPD, chronic constipation, osteoarthritis, history of kidney stones and has a current active T11 burst fracture presenting for evaluation of multiple complaints.  She has continued pain in her mid to lower back but also endorses abdominal pain and constipation.  She states she has not had a bowel movement in 4 days.  She denies abdominal distention, also denies nausea or vomiting.  She does have some pain and pressure at her rectum.  She has taken over-the-counter medications including Colace and MiraLAX without significant improvement in the constipation.  She has a history of neurogenic bladder for which she self caths but states for the past several days she has actually had urinary incontinence.  She was treated for her compression fracture with a TLSO brace but she states it was not helping her as it was "around her neck" and did not feel that it supported her lower back.  She was referred to [place] [facility] and had made an appointment to see them last week, however for some reason unknown to her the appointment was canceled.  She denies weakness in her lower extremities, she does have swelling of the dorsum of her right foot which is also new along with tenderness to palpation.  She denies any injuries to this foot, denies any complaints with the left foot.  She has found no alleviators for her symptoms.  The history is provided by the patient.       Home Medications Prior to Admission medications   Medication Sig Start Date End Date Taking? Authorizing Provider  alfuzosin (UROXATRAL) 10 MG 24 hr tablet Take 1 tablet (10 mg total) by mouth daily with breakfast. 06/27/21   Franchot Gallo, MD  cefUROXime (CEFTIN) 500 MG tablet Take 1 tablet (500 mg total) by  mouth 2 (two) times daily with a meal. 06/23/21   Davonna Belling, MD  Cholecalciferol (VITAMIN D) 50 MCG (2000 UT) tablet Take 2,000 Units by mouth daily.    [provider]  docusate sodium (COLACE) 100 MG capsule Take 1 capsule (100 mg total) by mouth every 12 (twelve) hours. 06/23/21   Davonna Belling, MD  estradiol (ESTRACE) 0.1 MG/GM vaginal cream Apply a pea size amount of cream to urethral area of vagina twice weekly 07/06/21   Summerlin, Berneice Heinrich, PA-C  mirtazapine (REMERON) 15 MG tablet Take 15 mg by mouth at bedtime. 06/29/21   [provider]  omeprazole (PRILOSEC) 40 MG capsule Take 40 mg by mouth daily. 06/29/21   [provider]  ondansetron (ZOFRAN) 4 MG tablet Take 1 tablet (4 mg total) by mouth every 6 (six) hours. 06/23/21   Davonna Belling, MD  ondansetron (ZOFRAN-ODT) 4 MG disintegrating tablet '4mg'$  ODT q4 hours prn nausea/vomit 06/23/21   Mesner, Corene Cornea, MD  oxyCODONE-acetaminophen (PERCOCET/ROXICET) 5-325 MG tablet Take 1 tablet by mouth every 6 (six) hours as needed for up to 12 doses for severe pain. 06/20/21   Luna Fuse, MD  polyethylene glycol [facility]) packet Take 17 g by mouth daily. 12/21/17   Carroll Sage, MD  potassium chloride SA (KLOR-CON M) 20 MEQ tablet Take 2 tablets (40 mEq total) by mouth 2 (two) times daily. 06/23/21   Davonna Belling, MD  tamsulosin (FLOMAX) 0.4 MG  CAPS capsule Take 0.4 mg by mouth daily. 06/29/21   [provider]      Allergies    Contrast media [iodinated contrast media], Barium-containing compounds, Cephalosporins, Ciprofloxacin, Sulfa antibiotics, Brompheniramine, Ceclor [cefaclor], Codeine, Dimetapp [albertsons di bromm], Penicillins, Phenylpropanolamine, Latex, and Other    Review of Systems   Review of Systems  Constitutional:  Negative for fever.  HENT:  Negative for congestion and sore throat.   Eyes: Negative.   Respiratory:  Negative for chest tightness and shortness of breath.    Cardiovascular:  Negative for chest pain.  Gastrointestinal:  Positive for abdominal pain and constipation. Negative for nausea.  Genitourinary:  Positive for enuresis.  Musculoskeletal:  Positive for back pain. Negative for arthralgias, joint swelling and neck pain.  Skin: Negative.  Negative for rash and wound.  Neurological:  Negative for dizziness, weakness, light-headedness, numbness and headaches.  Psychiatric/Behavioral: Negative.      Physical Exam Updated Vital Signs BP (!) 152/79   Pulse 95   Temp 99.5 F (37.5 C) (Oral)   Resp 20   Ht '5\' 7"'$  (1.702 m)   Wt 43.1 kg   SpO2 94%   BMI 14.88 kg/m  Physical Exam Vitals and nursing note reviewed.  Constitutional:      Appearance: She is well-developed. She is ill-appearing.     Comments: Chronically ill appearing, cachectic  HENT:     Head: Normocephalic and atraumatic.  Eyes:     Conjunctiva/sclera: Conjunctivae normal.  Cardiovascular:     Rate and Rhythm: Normal rate and regular rhythm.     Heart sounds: Normal heart sounds.  Pulmonary:     Effort: Pulmonary effort is normal.     Breath sounds: Normal breath sounds. No wheezing.  Abdominal:     General: Bowel sounds are normal. There is no distension.     Palpations: Abdomen is soft.     Tenderness: There is abdominal tenderness in the suprapubic area and left lower quadrant. There is no guarding.  Musculoskeletal:        General: Normal range of motion.     Cervical back: Normal range of motion.     Thoracic back: Bony tenderness present.       Back:     Right lower leg: Edema present.  Skin:    General: Skin is warm and dry.  Neurological:     Mental Status: She is alert.     ED Results / Procedures / Treatments   Labs (all labs ordered are listed, but only abnormal results are displayed) Labs Reviewed  BASIC METABOLIC PANEL - Abnormal; Notable for the following components:      Result Value   Potassium 3.0 (*)    Glucose, Bld 117 (*)    All  other components within normal limits  CBC WITH DIFFERENTIAL/PLATELET  URINALYSIS, ROUTINE W REFLEX MICROSCOPIC    EKG None  Radiology DG Foot Complete Right  Result Date: 07/14/2021 CLINICAL DATA:  Right foot pain and swelling. EXAM: RIGHT FOOT COMPLETE - 3+ VIEW COMPARISON:  None Available. FINDINGS: No fracture. No bone lesion. Skeletal structures are demineralized. Joints are normally spaced and aligned. Soft tissues are unremarkable. IMPRESSION: No fracture or acute finding. Electronically Signed   By: Lajean Manes M.D.   On: 07/14/2021 19:12   CT ABDOMEN PELVIS WO CONTRAST  Result Date: 07/14/2021 CLINICAL DATA:  Abdominal pain, acute, nonlocalized. Rt sided abd pain, lower back pain. Pt stated she hasn't had a BM in 5 days. EXAM:  CT ABDOMEN AND PELVIS WITHOUT CONTRAST TECHNIQUE: Multidetector CT imaging of the abdomen and pelvis was performed following the standard protocol without IV contrast. RADIATION DOSE REDUCTION: This exam was performed according to the departmental dose-optimization program which includes automated exposure control, adjustment of the mA and/or kV according to patient size and/or use of iterative reconstruction technique. COMPARISON:  CT thoracolumbar spine 06/20/2021, CT abdomen pelvis 07/08/2021, abdomen pelvis 12/20/2017 FINDINGS: Lower chest: No acute abnormality. Hepatobiliary: Couple of pericentimeter fluid density lesions within the liver likely represent simple hepatic cysts. Otherwise no focal liver abnormality. No gallstones, gallbladder wall thickening, or pericholecystic fluid. No biliary dilatation. Pancreas: No focal lesion. Normal pancreatic contour. No surrounding inflammatory changes. No main pancreatic ductal dilatation. Spleen: Normal in size without focal abnormality. Adrenals/Urinary Tract: No adrenal nodule bilaterally. No hydronephrosis. At least 3 right calcified stones measuring up to 3 mm. No left nephrolithiasis. No ureterolithiasis or  hydroureter. Likely left parapelvic cysts that appears simple. Simple renal cysts, in the absence of clinically indicated signs/symptoms, require no independent follow-up. The urinary bladder is unremarkable. Stomach/Bowel: Partial colectomy with a likely ileosigmoid anastomosis noted within the pelvis. Stomach is within normal limits. No evidence of bowel wall thickening or dilatation. Stool throughout the rectosigmoid colon. Appendix appears normal. Vascular/Lymphatic: No abdominal aorta or iliac aneurysm. Severe atherosclerotic plaque of the aorta and its branches. No abdominal, pelvic, or inguinal lymphadenopathy. Reproductive: Uterus and bilateral adnexa are unremarkable. Other: CP No intraperitoneal free fluid. No intraperitoneal free gas. No organized fluid collection. Musculoskeletal: No abdominal wall hernia or abnormality. No suspicious lytic or blastic osseous lesions. No acute displaced fracture. Chronic healed right sacral al a fracture. Multilevel severe degenerative changes of the spine in a patient status post kyphoplasty at the L3 level. Stable chronic multilevel thoracolumbar spine compression fractures. IMPRESSION: 1. Increased stool burden within the rectosigmoid colon in a patient status post partial colectomy. 2. Nonobstructive right nephrolithiasis measuring up to 3 mm. 3.  Aortic Atherosclerosis (ICD10-I70.0). Electronically Signed   By: Iven Finn M.D.   On: 07/14/2021 18:38    Procedures Procedures    Medications Ordered in ED Medications  potassium chloride SA (KLOR-CON M) CR tablet 40 mEq (has no administration in time range)  bisacodyl (DULCOLAX) EC tablet 5 mg (has no administration in time range)  sodium phosphate (FLEET) 7-19 GM/118ML enema 1 enema (has no administration in time range)  ondansetron (ZOFRAN) injection 4 mg (4 mg Intravenous Given 07/14/21 1809)  morphine (PF) 2 MG/ML injection 2 mg (2 mg Intravenous Given 07/14/21 1809)    ED Course/ Medical Decision  Making/ A&P                           Medical Decision Making Patient with multiple complaints, acute on chronic low back pain with a known T11 compression fracture currently awaiting neurosurgical evaluation.  Also with complaints of abdominal pain and constipation, has a chronically neurogenic bladder but has had incontinence over the past several days.  Having suprapubic and left lower quadrant abdominal pain.  Labs obtained revealing for a hypokalemia at 3.0.  This was replaced with oral supplementation.  Rectal exam does not reveal a rectal impaction but CT imaging does confirm significant constipation.  Her urine appears quite cloudy, pending urinalysis at this time.  Patient discussed with Alyse Low, PA-C who assumes care of patient.   Amount and/or Complexity of Data Reviewed Labs: ordered.    Details: potassium 3.0. Radiology: ordered.  Details: Ct - moderate constipation, no rectal impaction.  Risk Prescription drug management.           Final Clinical Impression(s) / ED Diagnoses Final diagnoses:  None    Rx / DC Orders ED Discharge Orders     None         Landis Martins 07/14/21 1958    Hayden Rasmussen, MD 07/15/21 1044

## 2021-07-14 NOTE — ED Notes (Signed)
Following Fleet enema, Pt able to have a BM and Pt cleaned at bedside.

## 2021-07-14 NOTE — Discharge Instructions (Signed)
Follow up with your Physician for recheck  

## 2021-07-14 NOTE — ED Provider Notes (Signed)
Patient's care assumed at 7 PM from [employer].  Patient's urinalysis is pending.  Patient has chronic constipation and urinary symptoms.  Cath UA shows large nitrates large leukocytes greater than 50 white blood cells and rare bacteria.  Reviewed patient's recent labs she was recently treated for a E. coli UTI by urology.  Sensitivity studies showed it was susceptible to Macrobid.  Will place patient on Macrobid for the next 7 days I will culture her urine patient is advised to follow-up with her primary care physicians she is given MiraLAX prescription for constipation   Sidney Ace 07/14/21 2157    Hayden Rasmussen, MD 07/15/21 1044

## 2021-07-16 LAB — URINE CULTURE

## 2021-07-17 LAB — URINE CULTURE: Culture: >=100000 — AB

## 2021-07-18 ENCOUNTER — Telehealth: Payer: Self-pay | Admitting: Emergency Medicine

## 2021-07-18 NOTE — Telephone Encounter (Signed)
Post ED Visit - Positive Culture Follow-up: Successful Patient Follow-Up  Culture assessed and recommendations reviewed by:  '[]'$  Elenor Quinones, Pharm.D. '[]'$  Heide Guile, Pharm.D., BCPS AQ-ID '[]'$  Parks Neptune, Pharm.D., BCPS '[]'$  Alycia Rossetti, Pharm.D., BCPS '[]'$  M[place]wan, Pharm.D., BCPS, AAHIVP '[]'$  Legrand Como, Pharm.D., BCPS, AAHIVP '[]'$  Salome Arnt, PharmD, BCPS '[]'$  Johnnette Gourd, PharmD, BCPS '[]'$  Hughes Better, PharmD, BCPS '[]'$  Leeroy Cha, PharmD  Positive urine culture  '[]'$  Patient discharged without antimicrobial prescription and treatment is now indicated '[]'$  Organism is resistant to prescribed ED discharge antimicrobial '[]'$  Patient with positive blood cultures  Changes discussed with ED provider: Dorise Bullion [facility] New antibiotic prescription symptom check, is asymptomatic, continue nitrofurantoin, if has continued symptoms which she states she has, switch to Doxycycline '100mg'$  po bid x 7 days Called to [facility] @ [phone] left rx on voicemail, closed today for 7/4 holiday  Contacted patient   Jody [name] 07/18/2021, 12:00 PM

## 2021-07-18 NOTE — Progress Notes (Signed)
ED Antimicrobial Stewardship Positive Culture Follow Up   Jody [name] is an 81 y.o. female who presented to [facility] on 07/14/2021 with a chief complaint of  Chief Complaint  Patient presents with   Back Pain    Recent Results (from the past 720 hour(s))  Urine Culture     Status: Abnormal   Collection Time: 06/22/21  6:13 PM   Specimen: Urine, Catheterized  Result Value Ref Range Status   Specimen Description   Final    URINE, CATHETERIZED Performed at [facility], [address] [place], [place] [address]    Special Requests   Final    NONE Performed at [facility], [address], [place], [place] [address]    Culture >=100,000 COLONIES/mL ESCHERICHIA COLI (A)  Final   Report Status 06/25/2021 FINAL  Final   Organism ID, Bacteria ESCHERICHIA COLI (A)  Final      Susceptibility   Escherichia coli - MIC*    AMPICILLIN >=32 RESISTANT Resistant     CEFAZOLIN <=4 SENSITIVE Sensitive     CEFEPIME <=0.12 SENSITIVE Sensitive     CEFTRIAXONE <=0.25 SENSITIVE Sensitive     CIPROFLOXACIN >=4 RESISTANT Resistant     GENTAMICIN <=1 SENSITIVE Sensitive     IMIPENEM <=0.25 SENSITIVE Sensitive     NITROFURANTOIN <=16 SENSITIVE Sensitive     TRIMETH/SULFA <=20 SENSITIVE Sensitive     AMPICILLIN/SULBACTAM >=32 RESISTANT Resistant     PIP/TAZO <=4 SENSITIVE Sensitive     * >=100,000 COLONIES/mL ESCHERICHIA COLI  Microscopic Examination     Status: Abnormal   Collection Time: 06/27/21  4:12 PM   Urine  Result Value Ref Range Status   WBC, UA 6-10 (A) 0 - 5 /hpf Final   RBC, Urine 0-2 0 - 2 /hpf Final   Epithelial Cells (non renal) 0-10 0 - 10 /hpf Final   Bacteria, UA Moderate (A) None seen/Few Final  Microscopic Examination     Status: Abnormal   Collection Time: 07/04/21 12:04 PM   Urine  Result Value Ref Range Status   WBC, UA 6-10 (A) 0 - 5 /hpf Final   RBC, Urine None seen 0 - 2 /hpf Final   Epithelial Cells (non renal) 0-10 0 - 10 /hpf Final   Crystals Present (A) N/A  Final   Crystal Type Amorphous Sediment N/A Final   Bacteria, UA Moderate (A) None seen/Few Final  Urine Culture     Status: None   Collection Time: 07/08/21 12:58 PM   Specimen: Urine, Clean Catch  Result Value Ref Range Status   Specimen Description   Final    URINE, CLEAN CATCH Performed at [facility], [address], [place], [place] [address]    Special Requests   Final    NONE Performed at [facility], [address] [place], [place] [address]    Culture   Final    NO GROWTH Performed at [facility], [address] [place], [place] [address]    Report Status 07/10/2021 FINAL  Final  Urine Culture     Status: Abnormal   Collection Time: 07/11/21  4:33 PM   Specimen: Urine, Clean Catch   UR  Result Value Ref Range Status   Urine Culture, Routine Final report (A)  Final   Organism ID, Bacteria Comment (A)  Final    Comment: Carbapenem-resistant Enterobacteriaceae (CRE) - Klebsiella pneumoniae Multi-Drug Resistant Organism Cefazolin <=4 ug/mL Cefazolin with an MIC <=16 predicts susceptibility to the oral agents  cefaclor, cefdinir, cefpodoxime, cefprozil, cefuroxime, cephalexin, and loracarbef when used for therapy of uncomplicated urinary tract infections due to E. coli, Klebsiella pneumoniae, and Proteus mirabilis. Greater than 100,000 colony forming units per mL    Antimicrobial Susceptibility Comment  Final    Comment:       ** S = Susceptible; I = Intermediate; R = Resistant **                    P = Positive; N = Negative             MICS are expressed in micrograms per mL    Antibiotic                 RSLT#1    RSLT#2    RSLT#3    RSLT#4 Amoxicillin/Clavulanic Acid    I Ampicillin                     R Cefepime                       S Ceftriaxone                    S Cefuroxime                     S Ciprofloxacin                  S Ertapenem                      S Gentamicin                     S Imipenem                       R Levofloxacin                    R Meropenem                      S Nitrofurantoin                 R Piperacillin/Tazobactam        S Tetracycline                   S Tobramycin                     S Trimethoprim/Sulfa             S   Urine Culture     Status: Abnormal   Collection Time: 07/14/21  9:52 PM   Specimen: Urine, Catheterized  Result Value Ref Range Status   Specimen Description   Final    URINE, CATHETERIZED Performed at [facility], [address] [place], [place] [address]    Special Requests   Final    NONE Performed at [facility], [address], [place], [place] [address]    Culture >=100,000 COLONIES/mL KLEBSIELLA PNEUMONIAE (A)  Final   Report Status 07/17/2021 FINAL  Final   Organism ID, Bacteria KLEBSIELLA PNEUMONIAE (A)  Final      Susceptibility   Klebsiella pneumoniae - MIC*    AMPICILLIN >=32 RESISTANT Resistant     CEFAZOLIN <=4 SENSITIVE Sensitive     CEFEPIME <=0.12 SENSITIVE Sensitive     CEFTRIAXONE <=0.25 SENSITIVE Sensitive  CIPROFLOXACIN <=0.25 SENSITIVE Sensitive     GENTAMICIN <=1 SENSITIVE Sensitive     IMIPENEM <=0.25 SENSITIVE Sensitive     NITROFURANTOIN 64 INTERMEDIATE Intermediate     TRIMETH/SULFA <=20 SENSITIVE Sensitive     AMPICILLIN/SULBACTAM 4 SENSITIVE Sensitive     PIP/TAZO <=4 SENSITIVE Sensitive     * >=100,000 COLONIES/mL KLEBSIELLA PNEUMONIAE    Treated with nitrofurantoin, organism intermediate to prescribed antimicrobial.   New antibiotic prescription: Please call patient for symptom check. If asymptomatic, continue current prescription for nitrofurantoin to complete 7 day course. If pt continues to have urinary incontinence, suprapubic tenderness, or other s/sx of UTI, then discontinue nitrofurantoin and start doxycycline '100mg'$  by mouth twice daily x 7 days.   ED Provider: Dorise Bullion, PA-C   Kaleen Mask 07/18/2021, 7:49 AM Clinical Pharmacist Monday - Friday phone -  [phone] Saturday - Sunday phone - [phone]

## 2021-07-19 ENCOUNTER — Telehealth: Payer: Self-pay

## 2021-07-19 MED ORDER — DOXYCYCLINE HYCLATE 100 MG PO CAPS: 100.0000 mg | ORAL_CAPSULE | Freq: Two times a day (BID) | ORAL | 0 refills | Status: AC

## 2021-07-19 NOTE — Telephone Encounter (Signed)
Called patient to inform her of results and rx at pharmacy.  Patient voiced understanding.

## 2021-07-19 NOTE — Telephone Encounter (Signed)
-----   Message from Reynaldo Minium, [place] sent at 07/19/2021 12:00 PM EDT ----- Please let the patient know that her urine culture from the emergency department indicates resistance to the nitrofurantoin she was prescribed.  She needs to start a new prescription of doxycycline twice daily which I will send in now. ----- Message ----- From: Iris Pert, LPN Sent: 06/23/319   9:43 AM EDT To: Franchot Gallo, MD; #  Patient started on Nitrofurantoin by another provider on 6/30

## 2021-07-20 ENCOUNTER — Ambulatory Visit (HOSPITAL_COMMUNITY): Admission: RE | Admit: 2021-07-20 | Payer: Medicare Other | Source: Home / Self Care | Admitting: Urology

## 2021-07-20 ENCOUNTER — Encounter (HOSPITAL_COMMUNITY): Admission: RE | Payer: Self-pay | Source: Home / Self Care

## 2021-07-20 SURGERY — CYSTOURETEROSCOPY, WITH RETROGRADE PYELOGRAM AND STENT INSERTION
Anesthesia: General | Laterality: Right
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
## 2021-07-26 ENCOUNTER — Ambulatory Visit: Payer: Medicare Other | Admitting: Physician Assistant

## 2021-07-28 ENCOUNTER — Other Ambulatory Visit: Payer: Self-pay | Admitting: Urology

## 2021-07-28 DIAGNOSIS — N200 Calculus of kidney: Secondary | ICD-10-CM

## 2021-08-13 ENCOUNTER — Encounter (HOSPITAL_COMMUNITY): Payer: Self-pay | Admitting: *Deleted

## 2021-08-13 ENCOUNTER — Other Ambulatory Visit: Payer: Self-pay

## 2021-08-13 ENCOUNTER — Emergency Department (HOSPITAL_COMMUNITY)
Admission: EM | Admit: 2021-08-13 | Discharge: 2021-08-13 | Disposition: A | Discharge status: Home / Self Care | Payer: Medicare Other | Attending: Emergency Medicine | Admitting: Emergency Medicine

## 2021-08-13 DIAGNOSIS — S22080D Wedge compression fracture of T11-T12 vertebra, subsequent encounter for fracture with routine healing: Secondary | ICD-10-CM

## 2021-08-13 DIAGNOSIS — R531 Weakness: Secondary | ICD-10-CM | POA: Diagnosis not present

## 2021-08-13 DIAGNOSIS — S22089D Unspecified fracture of T11-T12 vertebra, subsequent encounter for fracture with routine healing: Secondary | ICD-10-CM | POA: Insufficient documentation

## 2021-08-13 DIAGNOSIS — S3992XD Unspecified injury of lower back, subsequent encounter: Secondary | ICD-10-CM | POA: Diagnosis present

## 2021-08-13 DIAGNOSIS — Z91040 Latex allergy status: Secondary | ICD-10-CM | POA: Insufficient documentation

## 2021-08-13 DIAGNOSIS — J449 Chronic obstructive pulmonary disease, unspecified: Secondary | ICD-10-CM | POA: Diagnosis not present

## 2021-08-13 DIAGNOSIS — X58XXXD Exposure to other specified factors, subsequent encounter: Secondary | ICD-10-CM | POA: Insufficient documentation

## 2021-08-13 MED ORDER — OXYCODONE-ACETAMINOPHEN 5-325 MG PO TABS
1.0000 | ORAL_TABLET | Freq: Once | ORAL | Status: AC
  Administered 2021-08-13: 1 via ORAL
  Filled 2021-08-13: qty 1

## 2021-08-13 MED ORDER — ONDANSETRON HCL 4 MG PO TABS
4.0000 mg | ORAL_TABLET | Freq: Four times a day (QID) | ORAL | 0 refills | Status: DC
Start: 2021-08-13 — End: 2022-01-31

## 2021-08-13 MED ORDER — OXYCODONE-ACETAMINOPHEN 5-325 MG PO TABS: 1.0000 | ORAL_TABLET | Freq: Four times a day (QID) | ORAL | 0 refills | Status: DC | PRN

## 2021-08-13 NOTE — ED Provider Notes (Signed)
Medical screening examination/treatment/procedure(s) were conducted as a shared visit with non-physician practitioner(s) and myself.  I personally evaluated the patient during the encounter.  Clinical Impression:   Final diagnoses:  None    This patient is a 81 year old female who recently was diagnosed with a thoracic burst fracture around T11-T12, MRI confirmed there is no spinal stenosis.  She has been followed by orthopedist in [place] however they have tried to fit her with a brace which she states does not fit so she does not wear it, they have wanted her to have an MRI, the family is unaware of when that is scheduled for, her pain today is similar to what it has been although she feels like it is gradually getting worse over time.  On exam the patient is able to move all 4 extremities, she prefers to lay on her side due to the discomfort of laying on her back.  Her heart and lung exams are unremarkable, she has normal strength and sensation to the legs.  I informed the family that they will need to follow-up with orthopedics as we not only do not have MRI today but we also do not have anybody to custom-made back braces.  They have an orthopedist that they can follow-up with and are agreeable.  Stable for discharge

## 2021-08-13 NOTE — ED Provider Notes (Signed)
**Jody [name] De-Identified via Obfuscation** Jody Jody [name]   CSN: [id number] Arrival date & time: 08/13/21  5277     History  Chief Complaint  Patient presents with   Back Pain    Jody Jody [name] is a 81 y.o. female with history of COPD, vertebral compression fractures who presents to the emergency department for evaluation of ongoing back pain.  Patient lives in [place] and they come down here for further emergency care as they do do not like the care they have received at the emergency department in [place].  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  She is accompanied by her son who she lives with and provides most of her care and history.  He states they have been unable to get to the neurosurgeon referral here in [place] as they do not take her [place] [employer].  Patient did see an Ortho surgery physician in [place] on 07/21/2021 who provided her with a back brace and ordered her an urgent thoracic and lumbar MRIs to evaluate for significant disc protrusion or nerve root impingement.  She did not have this imaging done.  She denies new trauma or injury.  She states the back brace does not help as it does not properly fit her in size up to her neck.  Patient and son do endorse some weakness, although this again seems to be chronic.  She is able to get up and walk around with son's help but is unable to support herself using a walker.  She denies numbness, tingling, abdominal pain, chest pain, shortness of breath.   Back Pain      Home Medications Prior to Admission medications   Medication Sig Start Date End Date Taking? Authorizing Provider  ondansetron (ZOFRAN) 4 MG tablet Take 1 tablet (4 mg total) by mouth every 6 (six) hours. 08/13/21  Yes Tonye Pearson, PA-C  oxyCODONE-acetaminophen (PERCOCET/ROXICET) 5-325 MG tablet Take 1 tablet by mouth every 6 (six) hours as needed for severe  pain. 08/13/21  Yes Tonye Pearson, PA-C  alfuzosin (UROXATRAL) 10 MG 24 hr tablet Take 1 tablet (10 mg total) by mouth daily with breakfast. 06/27/21   Franchot Gallo, MD  Cholecalciferol (VITAMIN D) 50 MCG (2000 UT) tablet Take 2,000 Units by mouth daily.    [provider]  docusate sodium (COLACE) 100 MG capsule Take 1 capsule (100 mg total) by mouth every 12 (twelve) hours. 06/23/21   Davonna Belling, MD  doxycycline (VIBRAMYCIN) 100 MG capsule Take 1 capsule (100 mg total) by mouth every 12 (twelve) hours. 07/19/21   Summerlin, Berneice Heinrich, PA-C  estradiol (ESTRACE) 0.1 MG/GM vaginal cream Apply a pea size amount of cream to urethral area of vagina twice weekly 07/06/21   Summerlin, Cristal Ford Annette, PA-C  mirtazapine (REMERON) 15 MG tablet Take 15 mg by mouth at bedtime. 06/29/21   [provider]  omeprazole (PRILOSEC) 40 MG capsule Take 40 mg by mouth daily. 06/29/21   [provider]  ondansetron (ZOFRAN) 4 MG tablet Take 1 tablet (4 mg total) by mouth every 6 (six) hours. 06/23/21   Davonna Belling, MD  ondansetron (ZOFRAN-ODT) 4 MG disintegrating tablet '4mg'$  ODT q4 hours prn nausea/vomit 06/23/21   Mesner, Corene Cornea, MD  oxyCODONE-acetaminophen (PERCOCET/ROXICET) 5-325 MG tablet Take 1 tablet by mouth every 6 (six) hours as needed for up to 12 doses for severe pain. 06/20/21   Luna Fuse, MD  polyethylene glycol (MIRALAX) 17 g packet Take 17 g by mouth daily. 07/14/21   Fransico Meadow, PA-C  potassium chloride SA (KLOR-CON M) 20 MEQ tablet Take 2 tablets (40 mEq total) by mouth 2 (two) times daily. 06/23/21   Davonna Belling, MD  tamsulosin (FLOMAX) 0.4 MG CAPS capsule Take 0.4 mg by mouth daily. 06/29/21   [provider]      Allergies    Contrast media [iodinated contrast media], Barium-containing compounds, Cephalosporins, Ciprofloxacin, Sulfa antibiotics, Brompheniramine, Ceclor [cefaclor], Codeine, Dimetapp [albertsons di bromm], Penicillins,  Phenylpropanolamine, Latex, and Other    Review of Systems   Review of Systems  Musculoskeletal:  Positive for back pain.    Physical Exam Updated Vital Signs BP (!) 148/74   Pulse 95   Temp 98.2 F (36.8 C) (Oral)   Resp 18   Ht '5\' 7"'$  (1.702 m)   Wt 43.1 kg   SpO2 95%   BMI 14.88 kg/m  Physical Exam Vitals and nursing Jody [name] reviewed.  Constitutional:      General: She is not in acute distress.    Appearance: She is not ill-appearing.     Comments: Cachectic, chronically ill-appearing  HENT:     Head: Atraumatic.  Eyes:     Conjunctiva/sclera: Conjunctivae normal.  Cardiovascular:     Rate and Rhythm: Normal rate and regular rhythm.     Pulses: Normal pulses.          Radial pulses are 2+ on the right side and 2+ on the left side.       Dorsalis pedis pulses are 2+ on the right side and 2+ on the left side.     Heart sounds: No murmur heard. Pulmonary:     Effort: Pulmonary effort is normal. No respiratory distress.     Breath sounds: Normal breath sounds.  Abdominal:     General: Abdomen is flat. There is no distension.     Palpations: Abdomen is soft.     Tenderness: There is no abdominal tenderness.  Musculoskeletal:        General: Normal range of motion.     Cervical back: Normal range of motion.     Right lower leg: No edema.     Left lower leg: No edema.     Comments: Tender to palpation midline mid T-spine.    Skin:    General: Skin is warm and dry.     Capillary Refill: Capillary refill takes less than 2 seconds.  Neurological:     General: No focal deficit present.     Mental Status: She is alert.     Comments: Good straight leg raise off the table against gravity bilaterally.  Subjective sensation of the lower extremities intact bilaterally.  Psychiatric:        Mood and Affect: Mood normal.     ED Results / Procedures / Treatments   Labs (all labs ordered are listed, but only abnormal results are displayed) Labs Reviewed - No data to  display  EKG None  Radiology No results found.  Procedures Procedures    Medications Ordered in ED Medications  oxyCODONE-acetaminophen (PERCOCET/ROXICET) 5-325 MG per tablet 1 tablet (1 tablet Oral Given 08/13/21 1023)    ED Course/ Medical Decision Making/ A&P                           Medical Decision Making  81 year old female with history of known compression fractures presents to  the emergency department for evaluation of ongoing back pain.  Vitals without significant abnormality.  Patient is overall cachectic although there appears to be no new features to her back pain.  She is grossly neurovascularly intact.  Ultimately, patient has had MRI here done at [facility] last month and has seen an Network engineer in [place].  Since there have been no new falls and injuries are no new features to her, there is not much more that we can do for her from an emergency room standpoint.  Ultimately, patient does have care established with an Ortho surgeon and will need to follow-up with him to discuss getting a better fitting back brace and the possibility of surgical intervention.  I discussed this case with my attending, Dr. Sabra Heck, who agrees that patient will need to follow-up with her Ortho surgeon to discuss further evaluation and treatment.  He personally spoke and evaluated patient himself. We can provide a short course of pain medication to help work until her next follow-up appointment.  We have discussed red flag symptoms that warrant return to the emergency department.  Discharged home in stable condition.  Final Clinical Impression(s) / ED Diagnoses Final diagnoses:  Compression fracture of T11 vertebra with routine healing, subsequent encounter    Rx / DC Orders ED Discharge Orders          Ordered    oxyCODONE-acetaminophen (PERCOCET/ROXICET) 5-325 MG tablet  Every 6 hours PRN        08/13/21 1009    ondansetron (ZOFRAN) 4 MG tablet  Every 6 hours        08/13/21  1009              Tonye Pearson, [place] 08/13/21 1032    Noemi Chapel, MD 08/14/21 1146

## 2021-08-13 NOTE — Discharge Instructions (Addendum)
I am sorry that you are in so much pain from your back fractures.  Unfortunately, there is not much more we can do for you from an emergency room standpoint.  You need to follow-up with your Ortho surgeon who can help provide you with a better fitting brace and discussed with you surgical options to include a possible kyphoplasty which would provide relief from your spinal fractures.  I sent you in some pain medication that you can take as needed if Tylenol is not sufficient.  If it makes you sick, I have also sent you in some Zofran which you can dissolve under your tongue and can take at the same time as your pain medication.
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
## 2021-09-17 ENCOUNTER — Encounter (HOSPITAL_COMMUNITY): Payer: Self-pay | Admitting: *Deleted

## 2021-09-17 ENCOUNTER — Emergency Department (HOSPITAL_COMMUNITY)
Admission: EM | Admit: 2021-09-17 | Discharge: 2021-09-17 | Disposition: A | Discharge status: Home / Self Care | Payer: Medicare Other | Attending: Emergency Medicine | Admitting: Emergency Medicine

## 2021-09-17 ENCOUNTER — Other Ambulatory Visit: Payer: Self-pay

## 2021-09-17 DIAGNOSIS — J449 Chronic obstructive pulmonary disease, unspecified: Secondary | ICD-10-CM | POA: Insufficient documentation

## 2021-09-17 DIAGNOSIS — R600 Localized edema: Secondary | ICD-10-CM | POA: Diagnosis not present

## 2021-09-17 DIAGNOSIS — Z91040 Latex allergy status: Secondary | ICD-10-CM | POA: Diagnosis not present

## 2021-09-17 DIAGNOSIS — R1030 Lower abdominal pain, unspecified: Secondary | ICD-10-CM | POA: Insufficient documentation

## 2021-09-17 DIAGNOSIS — R609 Edema, unspecified: Secondary | ICD-10-CM

## 2021-09-17 DIAGNOSIS — M7989 Other specified soft tissue disorders: Secondary | ICD-10-CM | POA: Diagnosis present

## 2021-09-17 LAB — URINALYSIS, ROUTINE W REFLEX MICROSCOPIC
Bilirubin Urine: NEGATIVE
Glucose, UA: NEGATIVE mg/dL
Ketones, ur: NEGATIVE mg/dL
Nitrite: POSITIVE — AB
Protein, ur: 30 mg/dL — AB
Specific Gravity, Urine: 1.014 (ref 1.005–1.030)
pH: 5 (ref 5.0–8.0)

## 2021-09-17 NOTE — ED Provider Notes (Signed)
[facility] EMERGENCY DEPARTMENT Provider Note   CSN: [id number] Arrival date & time: 09/17/21  1512     History {Add pertinent medical, surgical, social history, OB history to HPI:1} Chief Complaint  Patient presents with   Leg Swelling    Jody [name] is a 81 y.o. female.  HPI Patient presents with swelling in her feet.  Has had for the last couple weeks.  Recently saw PCP for same.  Patient states they did nothing however.  States she was told to ice her feet after she goes to bathroom.  She is in a wheelchair at baseline.  Also back pain.  Known compression fractures.  Also urinary retention.  This has been going on for a long time.  Has a neurogenic bladder.  Has previously self cath.  Has seen urology.  Also however had seen neurosurgery for her back.  Had plans for MRI but has not had it done yet.  Initially had refused it.  Patient states she is in the ER to figure out what is going on with everything.  Also some constipation, which patient has also had in the past.   Past Medical History:  Diagnosis Date   Back pain    Constipation 12/2017   COPD (chronic obstructive pulmonary disease) (HCC)    Heart murmur    Osteoarthrosis    Past Surgical History:  Procedure Laterality Date   EPIDURAL BLOCK INJECTION     a few ago in [place]     cement placed   PARTIAL COLECTOMY     for constipation    Home Medications Prior to Admission medications   Medication Sig Start Date End Date Taking? Authorizing Provider  alfuzosin (UROXATRAL) 10 MG 24 hr tablet Take 1 tablet (10 mg total) by mouth daily with breakfast. 06/27/21   Franchot Gallo, MD  Cholecalciferol (VITAMIN D) 50 MCG (2000 UT) tablet Take 2,000 Units by mouth daily.    [provider]  docusate sodium (COLACE) 100 MG capsule Take 1 capsule (100 mg total) by mouth every 12 (twelve) hours. 06/23/21   Davonna Belling, MD  doxycycline (VIBRAMYCIN) 100 MG capsule  Take 1 capsule (100 mg total) by mouth every 12 (twelve) hours. 07/19/21   Summerlin, Berneice Heinrich, PA-C  estradiol (ESTRACE) 0.1 MG/GM vaginal cream Apply a pea size amount of cream to urethral area of vagina twice weekly 07/06/21   Summerlin, Cristal Ford Annette, PA-C  mirtazapine (REMERON) 15 MG tablet Take 15 mg by mouth at bedtime. 06/29/21   [provider]  omeprazole (PRILOSEC) 40 MG capsule Take 40 mg by mouth daily. 06/29/21   [provider]  ondansetron (ZOFRAN) 4 MG tablet Take 1 tablet (4 mg total) by mouth every 6 (six) hours. 06/23/21   Davonna Belling, MD  ondansetron (ZOFRAN) 4 MG tablet Take 1 tablet (4 mg total) by mouth every 6 (six) hours. 08/13/21   Tonye Pearson, PA-C  ondansetron (ZOFRAN-ODT) 4 MG disintegrating tablet '4mg'$  ODT q4 hours prn nausea/vomit 06/23/21   Mesner, Corene Cornea, MD  oxyCODONE-acetaminophen (PERCOCET/ROXICET) 5-325 MG tablet Take 1 tablet by mouth every 6 (six) hours as needed for up to 12 doses for severe pain. 06/20/21   Luna Fuse, MD  oxyCODONE-acetaminophen (PERCOCET/ROXICET) 5-325 MG tablet Take 1 tablet by mouth every 6 (six) hours as needed for severe pain. 08/13/21   Tonye Pearson, PA-C  polyethylene glycol (MIRALAX) 17 g packet Take 17 g by  mouth daily. 07/14/21   Fransico Meadow, PA-C  potassium chloride SA (KLOR-CON M) 20 MEQ tablet Take 2 tablets (40 mEq total) by mouth 2 (two) times daily. 06/23/21   Davonna Belling, MD  tamsulosin (FLOMAX) 0.4 MG CAPS capsule Take 0.4 mg by mouth daily. 06/29/21   [provider]      Allergies    Contrast media [iodinated contrast media], Barium-containing compounds, Cephalosporins, Ciprofloxacin, Sulfa antibiotics, Brompheniramine, Ceclor [cefaclor], Codeine, Dimetapp [albertsons di bromm], Penicillins, Phenylpropanolamine, Latex, and Other    Review of Systems   Review of Systems  Physical Exam Updated Vital Signs BP 93/78 (BP Location: Left Arm)   Pulse 85   Temp 98 F (36.7  C) (Oral)   Resp 16   Ht '5\' 7"'$  (1.702 m)   Wt 41.7 kg   SpO2 93%   BMI 14.41 kg/m  Physical Exam Vitals reviewed.  Constitutional:      Comments: Patient's wheelchair is at the bedside.  HENT:     Head: Normocephalic.  Cardiovascular:     Rate and Rhythm: Regular rhythm.  Abdominal:     Tenderness: There is abdominal tenderness.     Comments: Lower abdominal tenderness.  Some fullness.  No hernia palpated.  Musculoskeletal:     Comments: Edema bilateral feet.  No real edema on lower legs however.  Skin:    General: Skin is warm.  Neurological:     Mental Status: She is alert.     ED Results / Procedures / Treatments   Labs (all labs ordered are listed, but only abnormal results are displayed) Labs Reviewed  BASIC METABOLIC PANEL  URINALYSIS, ROUTINE W REFLEX MICROSCOPIC  CBC    EKG None  Radiology No results found.  Procedures Procedures  {Document cardiac monitor, telemetry assessment procedure when appropriate:1}  Medications Ordered in ED Medications - No data to display  ED Course/ Medical Decision Making/ A&P                           Medical Decision Making Amount and/or Complexity of Data Reviewed Labs: ordered.   Patient presents requesting to know what is been going on over the last months.  Has seen numerous specialists and PCP is for the same.  Is been seen in the ER.  Does have edema in her feet.  We will get some basic blood work.  Also has had urinary retention.  Reviewing notes it appears that spine surgeons are planning MRI.  Patient has not had done yet.  However it appears as if the urinary retention predates the injury.  Has had neurogenic bladder for a while now and has done catheterizations.  Will check postvoid residual.  {Document critical care time when appropriate:1} {Document review of labs and clinical decision tools ie heart score, Chads2Vasc2 etc:1}  {Document your independent review of radiology images, and any outside  records:1} {Document your discussion with family members, caretakers, and with consultants:1} {Document social determinants of health affecting pt's care:1} {Document your decision making why or why not admission, treatments were needed:1} Final Clinical Impression(s) / ED Diagnoses Final diagnoses:  None    Rx / DC Orders ED Discharge Orders     None
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
## 2021-09-17 NOTE — ED Triage Notes (Signed)
Pt with multiple complaints- swelling feet for past 1-2 weeks.  Also c/o constipation, LBM 3 days ago.

## 2021-09-17 NOTE — Discharge Instructions (Signed)
Your urine has not resulted yet.  Follow-up with your doctors.  Your blood work from around a week ago was reassuring.

## 2021-11-02 ENCOUNTER — Emergency Department (HOSPITAL_COMMUNITY)
Admission: EM | Admit: 2021-11-02 | Discharge: 2021-11-02 | Discharge status: Against Medical Advice | Payer: Medicare Other | Attending: Student | Admitting: Student

## 2021-11-02 ENCOUNTER — Emergency Department (HOSPITAL_COMMUNITY): Payer: Medicare Other

## 2021-11-02 ENCOUNTER — Encounter (HOSPITAL_COMMUNITY): Payer: Self-pay | Admitting: Emergency Medicine

## 2021-11-02 DIAGNOSIS — R339 Retention of urine, unspecified: Secondary | ICD-10-CM | POA: Diagnosis present

## 2021-11-02 DIAGNOSIS — R1084 Generalized abdominal pain: Secondary | ICD-10-CM | POA: Insufficient documentation

## 2021-11-02 DIAGNOSIS — Z5321 Procedure and treatment not carried out due to patient leaving prior to being seen by health care provider: Secondary | ICD-10-CM | POA: Diagnosis not present

## 2021-11-02 LAB — CBC WITH DIFFERENTIAL/PLATELET
Abs Immature Granulocytes: 0.02 10*3/uL (ref 0.00–0.07)
Basophils Absolute: 0 10*3/uL (ref 0.0–0.1)
Basophils Relative: 1 %
Eosinophils Absolute: 0 10*3/uL (ref 0.0–0.5)
Eosinophils Relative: 0 %
HCT: 40.2 % (ref 36.0–46.0)
Hemoglobin: 13.2 g/dL (ref 12.0–15.0)
Immature Granulocytes: 0 %
Lymphocytes Relative: 17 %
Lymphs Abs: 1.4 10*3/uL (ref 0.7–4.0)
MCH: 29.9 pg (ref 26.0–34.0)
MCHC: 32.8 g/dL (ref 30.0–36.0)
MCV: 91 fL (ref 80.0–100.0)
Monocytes Absolute: 0.6 10*3/uL (ref 0.1–1.0)
Monocytes Relative: 7 %
Neutro Abs: 6.1 10*3/uL (ref 1.7–7.7)
Neutrophils Relative %: 75 %
Platelets: 214 10*3/uL (ref 150–400)
RBC: 4.42 MIL/uL (ref 3.87–5.11)
RDW: 14.4 % (ref 11.5–15.5)
WBC: 8.2 10*3/uL (ref 4.0–10.5)
nRBC: 0 % (ref 0.0–0.2)

## 2021-11-02 LAB — COMPREHENSIVE METABOLIC PANEL
ALT: 15 U/L (ref 0–44)
AST: 17 U/L (ref 15–41)
Albumin: 3.6 g/dL (ref 3.5–5.0)
Alkaline Phosphatase: 116 U/L (ref 38–126)
Anion gap: 8 (ref 5–15)
BUN: 26 mg/dL — ABNORMAL HIGH (ref 8–23)
CO2: 28 mmol/L (ref 22–32)
Calcium: 9.7 mg/dL (ref 8.9–10.3)
Chloride: 106 mmol/L (ref 98–111)
Creatinine, Ser: 0.86 mg/dL (ref 0.44–1.00)
GFR, Estimated: >60 mL/min (ref >60)
Glucose, Bld: 114 mg/dL — ABNORMAL HIGH (ref 70–99)
Potassium: 3.1 mmol/L — ABNORMAL LOW (ref 3.5–5.1)
Sodium: 142 mmol/L (ref 135–145)
Total Bilirubin: 0.5 mg/dL (ref 0.3–1.2)
Total Protein: 6.5 g/dL (ref 6.5–8.1)

## 2021-11-02 LAB — LIPASE, BLOOD: Lipase: 30 U/L (ref 11–51)

## 2021-11-02 NOTE — ED Provider Triage Note (Signed)
Emergency Medicine Provider Triage Evaluation Note  Jody [name] , a 81 y.o. female  was evaluated in triage.  Pt complains of urinary retention and abdominal pain.  Patient states she was evaluated at an outside facility last week and diagnosed with a back fracture.  It was reportedly recommended that she have some sort of procedure but the patient refused due to pain.  Today she is complaining of urinary retention but states that the urinary retention has been ongoing for 6 months.  She also complains of generalized abdominal pain which is worsened in nature.  Denies nausea, vomiting, chest pain, shortness of breath.  Review of Systems  Positive: As above Negative: As above  Physical Exam  BP 109/61   Pulse (!) 103   Temp 98.7 F (37.1 C) (Oral)   Resp (!) 22   Ht '5\' 7"'$  (1.702 m)   Wt 40.8 kg   SpO2 93%   BMI 14.10 kg/m  Gen:   Awake, no distress   Resp:  Normal effort  MSK:   Moves extremities without difficulty  Other:    Medical Decision Making  Medically screening exam initiated at 12:20 PM.  Appropriate orders placed.  Jody [name] was informed that the remainder of the evaluation will be completed by another provider, this initial triage assessment does not replace that evaluation, and the importance of remaining in the ED until their evaluation is complete.     Dorothyann Peng, PA-C 11/02/21 1221

## 2021-11-02 NOTE — ED Notes (Signed)
Called pt x3 for vitals, no response. This NT checked the whole lobby vital signs.

## 2021-11-02 NOTE — ED Triage Notes (Signed)
Pt seen at [facility] last week and told she had back fx. PT endorses abd pain, urinary retention and nausea for 6 months.

## 2021-11-04 ENCOUNTER — Emergency Department (HOSPITAL_COMMUNITY)
Admission: EM | Admit: 2021-11-04 | Discharge: 2021-11-04 | Disposition: A | Discharge status: Home / Self Care | Payer: Medicare Other | Attending: Emergency Medicine | Admitting: Emergency Medicine

## 2021-11-04 ENCOUNTER — Other Ambulatory Visit: Payer: Self-pay

## 2021-11-04 ENCOUNTER — Encounter (HOSPITAL_COMMUNITY): Payer: Self-pay

## 2021-11-04 ENCOUNTER — Emergency Department (HOSPITAL_COMMUNITY): Payer: Medicare Other

## 2021-11-04 DIAGNOSIS — J449 Chronic obstructive pulmonary disease, unspecified: Secondary | ICD-10-CM | POA: Insufficient documentation

## 2021-11-04 DIAGNOSIS — X58XXXA Exposure to other specified factors, initial encounter: Secondary | ICD-10-CM | POA: Diagnosis not present

## 2021-11-04 DIAGNOSIS — S22060A Wedge compression fracture of T7-T8 vertebra, initial encounter for closed fracture: Secondary | ICD-10-CM | POA: Diagnosis not present

## 2021-11-04 DIAGNOSIS — S22050A Wedge compression fracture of T5-T6 vertebra, initial encounter for closed fracture: Secondary | ICD-10-CM | POA: Insufficient documentation

## 2021-11-04 DIAGNOSIS — Z87891 Personal history of nicotine dependence: Secondary | ICD-10-CM | POA: Diagnosis not present

## 2021-11-04 DIAGNOSIS — R1030 Lower abdominal pain, unspecified: Secondary | ICD-10-CM | POA: Insufficient documentation

## 2021-11-04 DIAGNOSIS — Z91040 Latex allergy status: Secondary | ICD-10-CM | POA: Insufficient documentation

## 2021-11-04 DIAGNOSIS — N3001 Acute cystitis with hematuria: Secondary | ICD-10-CM | POA: Insufficient documentation

## 2021-11-04 DIAGNOSIS — S2249XA Multiple fractures of ribs, unspecified side, initial encounter for closed fracture: Secondary | ICD-10-CM | POA: Diagnosis not present

## 2021-11-04 DIAGNOSIS — S22000A Wedge compression fracture of unspecified thoracic vertebra, initial encounter for closed fracture: Secondary | ICD-10-CM

## 2021-11-04 DIAGNOSIS — S22040A Wedge compression fracture of fourth thoracic vertebra, initial encounter for closed fracture: Secondary | ICD-10-CM | POA: Diagnosis not present

## 2021-11-04 DIAGNOSIS — R339 Retention of urine, unspecified: Secondary | ICD-10-CM | POA: Diagnosis present

## 2021-11-04 LAB — URINALYSIS, ROUTINE W REFLEX MICROSCOPIC
Bilirubin Urine: NEGATIVE
Glucose, UA: NEGATIVE mg/dL
Ketones, ur: NEGATIVE mg/dL
Nitrite: POSITIVE — AB
Protein, ur: 30 mg/dL — AB
Specific Gravity, Urine: 1.021 (ref 1.005–1.030)
pH: 5 (ref 5.0–8.0)

## 2021-11-04 LAB — CBC
HCT: 44 % (ref 36.0–46.0)
Hemoglobin: 14 g/dL (ref 12.0–15.0)
MCH: 29.5 pg (ref 26.0–34.0)
MCHC: 31.8 g/dL (ref 30.0–36.0)
MCV: 92.8 fL (ref 80.0–100.0)
Platelets: 234 10*3/uL (ref 150–400)
RBC: 4.74 MIL/uL (ref 3.87–5.11)
RDW: 14.4 % (ref 11.5–15.5)
WBC: 5.1 10*3/uL (ref 4.0–10.5)
nRBC: 0 % (ref 0.0–0.2)

## 2021-11-04 LAB — BASIC METABOLIC PANEL
Anion gap: 9 (ref 5–15)
BUN: 27 mg/dL — ABNORMAL HIGH (ref 8–23)
CO2: 25 mmol/L (ref 22–32)
Calcium: 9.6 mg/dL (ref 8.9–10.3)
Chloride: 105 mmol/L (ref 98–111)
Creatinine, Ser: 0.8 mg/dL (ref 0.44–1.00)
GFR, Estimated: >60 mL/min (ref >60)
Glucose, Bld: 135 mg/dL — ABNORMAL HIGH (ref 70–99)
Potassium: 2.9 mmol/L — ABNORMAL LOW (ref 3.5–5.1)
Sodium: 139 mmol/L (ref 135–145)

## 2021-11-04 MED ORDER — OXYCODONE-ACETAMINOPHEN 5-325 MG PO TABS
2.0000 | ORAL_TABLET | Freq: Once | ORAL | Status: AC
  Administered 2021-11-04: 2 via ORAL
  Filled 2021-11-04: qty 2

## 2021-11-04 MED ORDER — OXYCODONE HCL 5 MG PO TABS: 5.0000 mg | ORAL_TABLET | Freq: Four times a day (QID) | ORAL | 0 refills | Status: AC | PRN

## 2021-11-04 MED ORDER — POTASSIUM CHLORIDE CRYS ER 20 MEQ PO TBCR
60.0000 meq | EXTENDED_RELEASE_TABLET | Freq: Once | ORAL | Status: AC
  Administered 2021-11-04: 60 meq via ORAL
  Filled 2021-11-04: qty 3

## 2021-11-04 MED ORDER — FOSFOMYCIN TROMETHAMINE 3 G PO PACK
3.0000 g | PACK | Freq: Once | ORAL | Status: AC
Start: 2021-11-04 — End: 2021-11-04
  Administered 2021-11-04: 3 g via ORAL
  Filled 2021-11-04: qty 3

## 2021-11-04 NOTE — ED Triage Notes (Signed)
Pt states she has not urinated since yesterday. Pt reports having 2 fractured vertebra in her back. Rib pain. Pt went to [facility] on 10/19. Since then pt has been dribbling urine and has not fully urinated.

## 2021-11-04 NOTE — Discharge Instructions (Addendum)
We evaluated you for your urinary symptoms and back pain.  Your work-up shows a urine infection.  We have given you a dose of antibiotics 1 time here in the emergency department which should treat your infection.  You do not need a prescription for antibiotics at home.  We also discussed your compression fractures with the neurosurgeon.  He says that you only need to wear the brace if it helps with your pain.  I have prescribed you a short course of oxycodone for your pain, please take this only if your pain is unrelieved with Tylenol.  Please follow-up with your primary doctor for your pain.  He is return if you develop any numbness or tingling, weakness, flank pain, fevers or chills, confusion, or any other worsening symptoms.

## 2021-11-04 NOTE — ED Provider Notes (Signed)
[facility] EMERGENCY DEPARTMENT Provider Note  CSN: [id number] Arrival date & time: 11/04/21 1423  Chief Complaint(s) Urinary Retention  HPI Jody [name] is a 81 y.o. [name] with history of chronic back pain, chronic urinary retention, COPD presenting with urinary retention.  She reports over the past couple days she has only had some dribbling.  She reports some mild anterior abdominal pain in the lower abdomen.  She reports this is similar to the urinary retention she has been dealing with for years without significant change.  She reports chronic low back pain which is unchanged.  She reports that a few months ago she was diagnosed with some thoracic compression fractures.  She denies any new trauma.  She denies numbness or tingling, weakness.  She denies any fevers or chills.  She denies any nausea or vomiting.  Symptoms are moderate.   Past Medical History Past Medical History:  Diagnosis Date   Back pain    Constipation 12/2017   COPD (chronic obstructive pulmonary disease) (HCC)    Heart murmur    Osteoarthrosis    Patient Active Problem List   Diagnosis Date Noted   Urinary retention 12/20/2017   Back pain 01/24/2011   COPD (chronic obstructive pulmonary disease) ([place]) 01/24/2011   Home Medication(s) Prior to Admission medications   Medication Sig Start Date End Date Taking? Authorizing Provider  oxyCODONE (ROXICODONE) 5 MG immediate release tablet Take 1 tablet (5 mg total) by mouth every 6 (six) hours as needed for severe pain. 11/04/21  Yes Cristie Hem, MD  alfuzosin (UROXATRAL) 10 MG 24 hr tablet Take 1 tablet (10 mg total) by mouth daily with breakfast. 06/27/21   Franchot Gallo, MD  Cholecalciferol (VITAMIN D) 50 MCG (2000 UT) tablet Take 2,000 Units by mouth daily.    [provider]  docusate sodium (COLACE) 100 MG capsule Take 1 capsule (100 mg total) by mouth every 12 (twelve) hours. 06/23/21   Davonna Belling, MD  doxycycline (VIBRAMYCIN)  100 MG capsule Take 1 capsule (100 mg total) by mouth every 12 (twelve) hours. 07/19/21   Summerlin, Berneice Heinrich, PA-C  estradiol (ESTRACE) 0.1 MG/GM vaginal cream Apply a pea size amount of cream to urethral area of vagina twice weekly 07/06/21   Summerlin, Cristal Ford Annette, PA-C  mirtazapine (REMERON) 15 MG tablet Take 15 mg by mouth at bedtime. 06/29/21   [provider]  omeprazole (PRILOSEC) 40 MG capsule Take 40 mg by mouth daily. 06/29/21   [provider]  ondansetron (ZOFRAN) 4 MG tablet Take 1 tablet (4 mg total) by mouth every 6 (six) hours. 06/23/21   Davonna Belling, MD  ondansetron (ZOFRAN) 4 MG tablet Take 1 tablet (4 mg total) by mouth every 6 (six) hours. 08/13/21   Tonye Pearson, PA-C  ondansetron (ZOFRAN-ODT) 4 MG disintegrating tablet '4mg'$  ODT q4 hours prn nausea/vomit 06/23/21   Mesner, Corene Cornea, MD  polyethylene glycol (MIRALAX) 17 g packet Take 17 g by mouth daily. 07/14/21   Fransico Meadow, PA-C  potassium chloride SA (KLOR-CON M) 20 MEQ tablet Take 2 tablets (40 mEq total) by mouth 2 (two) times daily. 06/23/21   Davonna Belling, MD  tamsulosin (FLOMAX) 0.4 MG CAPS capsule Take 0.4 mg by mouth daily. 06/29/21   [provider]  Past Surgical History Past Surgical History:  Procedure Laterality Date   EPIDURAL BLOCK INJECTION     a few ago in [place]     cement placed   PARTIAL COLECTOMY     for constipation   Family History History reviewed. No pertinent family history.  Social History Social History   Tobacco Use   Smoking status: Former    Years: 1.00    Types: Cigarettes   Smokeless tobacco: Never   Tobacco comments:    1 pack of cigarettes last for 1 week.   Vaping Use   Vaping Use: Never used  Substance Use Topics   Alcohol use: No   Drug use: No    Allergies Contrast media [iodinated contrast media], Barium-containing compounds, Cephalosporins, Ciprofloxacin, Sulfa antibiotics, Brompheniramine, Ceclor [cefaclor], Codeine, Dimetapp [albertsons di bromm], Penicillins, Phenylpropanolamine, Latex, and Other  Review of Systems Review of Systems  All other systems reviewed and are negative.   Physical Exam Vital Signs  I have reviewed the triage vital signs BP 100/60   Pulse 100   Temp 98.2 F (36.8 C) (Oral)   Resp 20   Ht '5\' 7"'$  (1.702 m)   Wt 40.8 kg   SpO2 96%   BMI 14.10 kg/m  Physical Exam Vitals and nursing note reviewed.  Constitutional:      General: She is not in acute distress.    Appearance: She is well-developed.  HENT:     Head: Normocephalic and atraumatic.     Mouth/Throat:     Mouth: Mucous membranes are moist.  Eyes:     Pupils: Pupils are equal, round, and reactive to light.  Cardiovascular:     Rate and Rhythm: Normal rate and regular rhythm.     Heart sounds: No murmur heard. Pulmonary:     Effort: Pulmonary effort is normal. No respiratory distress.     Breath sounds: Normal breath sounds.  Abdominal:     General: Abdomen is flat.     Palpations: Abdomen is soft.     Tenderness: There is abdominal tenderness (mild suprapubic).  Musculoskeletal:        General: No tenderness.     Right lower leg: No edema.     Left lower leg: No edema.     Comments: Mild upper lumbar/lower thoracic midline tenderness, mild.  No step-off.  Skin:    General: Skin is warm and dry.  Neurological:     General: No focal deficit present.     Mental Status: She is alert. Mental status is at baseline.     Comments: Strength 5 out of 5 in the bilateral lower extremities, no sensory deficit to light touch, normal reflexes  Psychiatric:        Mood and Affect: Mood normal.        Behavior: Behavior normal.     ED Results and Treatments Labs (all labs ordered are listed, but only abnormal results are  displayed) Labs Reviewed  URINALYSIS, ROUTINE W REFLEX MICROSCOPIC - Abnormal; Notable for the following components:      Result Value   APPearance HAZY (*)    Hgb urine dipstick SMALL (*)    Protein, ur 30 (*)    Nitrite POSITIVE (*)    Leukocytes,Ua SMALL (*)    Bacteria, UA RARE (*)    Non Squamous Epithelial 0-5 (*)    All other components within normal limits  BASIC METABOLIC PANEL - Abnormal; Notable  for the following components:   Potassium 2.9 (*)    Glucose, Bld 135 (*)    BUN 27 (*)    All other components within normal limits  URINE CULTURE  CBC                                                                                                                          Radiology CT Thoracic Spine Wo Contrast  Result Date: 11/04/2021 CLINICAL DATA:  Mid back pain. Rib pain. Fractures identified on recent abdominal CT. EXAM: CT THORACIC SPINE WITHOUT CONTRAST TECHNIQUE: Multidetector CT images of the thoracic were obtained using the standard protocol without intravenous contrast. RADIATION DOSE REDUCTION: This exam was performed according to the departmental dose-optimization program which includes automated exposure control, adjustment of the mA and/or kV according to patient size and/or use of iterative reconstruction technique. COMPARISON:  CT of the abdomen pelvis 11/02/2021. MRI of the thoracic spine 06/20/2021 FINDINGS: Alignment: No significant listhesis is present. Exaggerated thoracic kyphosis has progressed. Mild rightward curvature is centered at T9-10. Vertebrae: Previous T10 compression fracture is stable. Multiple new fractures are present. A superior endplate fracture of T8 demonstrates over 70% loss of height. Inferior endplate fracture at T6 demonstrates 40% loss of height. A new inferior endplate fracture at T4 demonstrates 40% loss of height. No significant retropulsed bone is present at these levels. Acute posterior right tenth, eleventh and twelfth rib fractures are  present. No pneumothorax is associated. No acute left-sided fractures are present. Paraspinal and other soft tissues: Paraspinous soft tissues are within normal limits. Atherosclerotic calcifications are present in the aorta centrilobular emphysematous changes are present. Scarring is present laterally in the right upper lobe. The lungs are otherwise clear. Airways are patent. Disc levels: Fracture contributes to right greater than left foraminal narrowing at T6-7. The central canal is patent. T10 fracture contributes 2 mild foraminal narrowing bilaterally. The central canal is patent. No other focal stenosis is present. IMPRESSION: 1. Acute posterior right tenth, eleventh and twelfth rib fractures. 2. Spinal compression fractures at T4, T6 and T8 are new since the MRI in June. 3. Previous T10 compression fracture is stable. 4. Exaggerated thoracic kyphosis has progressed, associated with the additional fractures. 5. Fracture contributes to right greater than left foraminal narrowing at T6-7. 6. Mild foraminal narrowing bilaterally at T10. 7. Aortic Atherosclerosis (ICD10-I70.0) and Emphysema (ICD10-J43.9). Electronically Signed   By: San Morelle M.D.   On: 11/04/2021 17:59   CT L-SPINE NO CHARGE  Result Date: 11/04/2021 CLINICAL DATA:  Abdominal pain.  Fractured vertebra. EXAM: CT LUMBAR SPINE WITHOUT CONTRAST TECHNIQUE: Multidetector CT imaging of the lumbar spine was performed without intravenous contrast administration. Multiplanar CT image reconstructions were also generated. RADIATION DOSE REDUCTION: This exam was performed according to the departmental dose-optimization program which includes automated exposure control, adjustment of the mA and/or kV according to patient size and/or use of iterative reconstruction technique. COMPARISON:  CT of the abdomen and pelvis 11/02/2021. CT of  the lumbar spine without contrast 06/20/2021. FINDINGS: Segmentation: 5 non rib-bearing lumbar type vertebral  bodies are present. The lowest fully formed vertebral body is L5. Alignment: No significant listhesis is present. Lumbar lordosis is stable. Vertebrae: Spinal augmentation at L3 is stable. Remote superior endplate fractures are present at L4 and L5. A remote superior endplate fractures present at L1. The inferior endplate fracture at P80 noted on the June scan has subsequently healed. Paraspinal and other soft tissues: See CT of the abdomen and pelvis of the same day. Disc levels: No significant stenosis is present at L1-2 L2-3. Mild central canal narrowing present at L3-4 due to a broad-based disc protrusion and ligamentum flavum thickening. Mild to moderate central canal stenosis at L4-5 is stable. A broad-based disc protrusion and bilateral ligamentum flavum thickening contribute. Mild foraminal narrowing is present bilaterally. Facet hypertrophy is present at L5-S1 without focal stenosis. IMPRESSION: 1. Spinal augmentation at L3 is stable. 2. Remote superior endplate fractures at L4 and L5. 3. Remote superior endplate fracture at L1. 4. No acute fractures within the lumbar spine. 5. The inferior endplate fracture at D98 noted on the June scan has subsequently healed. 6. 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 7. Mild foraminal narrowing bilaterally at L4-5 is stable. Electronically Signed   By: San Morelle M.D.   On: 11/04/2021 17:34   CT Abdomen Pelvis Wo Contrast  Result Date: 11/04/2021 CLINICAL DATA:  Acute generalized abdominal pain. EXAM: CT ABDOMEN AND PELVIS WITHOUT CONTRAST TECHNIQUE: Multidetector CT imaging of the abdomen and pelvis was performed following the standard protocol without IV contrast. RADIATION DOSE REDUCTION: This exam was performed according to the departmental dose-optimization program which includes automated exposure control, adjustment of the mA and/or kV according to patient size and/or use of iterative  reconstruction technique. COMPARISON:  November 02, 2021. FINDINGS: Lower chest: No acute abnormality. Hepatobiliary: No gallstones or biliary dilatation is noted. Stable left hepatic cysts are noted. Pancreas: Unremarkable. No pancreatic ductal dilatation or surrounding inflammatory changes. Spleen: Normal in size without focal abnormality. Adrenals/Urinary Tract: Stable left adrenal nodule. Right adrenal gland is unremarkable. Nonobstructive right nephrolithiasis is noted. No hydronephrosis or renal obstruction is noted. Urinary bladder is unremarkable. Stomach/Bowel: The stomach appears normal. There is no definite evidence of bowel obstruction or inflammation. Postsurgical changes are seen in the sigmoid colon. Large amount of stool seen in the rectum. Vascular/Lymphatic: Aortic atherosclerosis. No enlarged abdominal or pelvic lymph nodes. Reproductive: Uterus and bilateral adnexa are unremarkable. Other: No abdominal wall hernia or abnormality. No abdominopelvic ascites. Musculoskeletal: Status post L3 kyphoplasty. No definite acute osseous abnormality is noted. Left rib fractures noted on prior exam are not well visualized currently. IMPRESSION: Stable left adrenal adenoma. Nonobstructive right nephrolithiasis. No definite evidence of bowel obstruction or inflammation. Large amount of stool seen in the rectum. Aortic Atherosclerosis (ICD10-I70.0). Electronically Signed   By: Marijo Conception M.D.   On: 11/04/2021 17:30    Pertinent labs & imaging results that were available during my care of the patient were reviewed by me and considered in my medical decision making (see MDM for details).  Medications Ordered in ED Medications  fosfomycin (MONUROL) packet 3 g (has no administration in time range)  potassium chloride SA (KLOR-CON M) CR tablet 60 mEq (60 mEq Oral Given 11/04/21 1720)  oxyCODONE-acetaminophen (PERCOCET/ROXICET) 5-325 MG per tablet 2 tablet (2 tablets Oral Given 11/04/21 1758)  Procedures Procedures  (including critical care time)  Medical Decision Making / ED Course   MDM:  81 year old [name] presenting with acute on chronic urinary retention.  Patient overall well-appearing.  Neurologic exam is reassuring.  She does have mild midline back tenderness.  Differential includes pain from prior compression fracture, new compression fracture, nephrolithiasis, urinary obstruction.  Very low concern for acute spinal cord compression or cauda equina syndrome, or occult spinal infection, patient reports that she has been dealing with the symptoms for at least a year.  Will obtain labs including urinalysis, may need to provide catheterized urine given history of urinary retention.  Kidney function reassuring.  Will obtain CT abdomen given abdominal pain and mild lower abdominal tenderness, likely due to urinary retention.  Given back pain we will add CT scans of the thoracic and lumbar spine to evaluate for acute fracture.  Clinical Course as of 11/04/21 1928  Sat Nov 04, 2021  1924 Urinalysis concerning for urinary infection. No sign of retention with PVR <50. Patient has significant prior allergies so we will treat with fosfomycin.  CT scan notable for multiple thoracic compression fractures.  No evidence of retropulsion.  Doubt spinal cord involvement.  Discussed with Dr. Kathyrn Sheriff of neurosurgery, he says no further treatment necessary, pain control, brace if patient feels that helps with her pain.  We will give small additional amount of oxycodone.  Patient has had recent oxycodone and Norco prescriptions but does have multiple fractures which are acute and can cause severe pain.  Advised that she should follow-up with her primary doctor for further pain medication, always try tylenol first. [place] was  reviewed. and patient was instructed, not to drive, operate heavy machinery, perform activities at heights, swimming or participation in water activities or provide baby-sitting services while on Pain, Sleep and Anxiety Medications; until their outpatient Physician has advised to do so again. Also recommended to not to take more than prescribed Pain, Sleep and Anxiety Medications. Will discharge patient to home. All questions answered. Patient comfortable with plan of discharge. Return precautions discussed with patient and specified on the after visit summary.  [WS]    Clinical Course User Index [WS] Cristie Hem, MD     Additional history obtained: -Additional history obtained from family -External records from outside source obtained and reviewed including: Chart review including previous notes, labs, imaging, consultation notes including 08/25/21 outside ortho note.    Lab Tests: -I ordered, reviewed, and interpreted labs.   The pertinent results include:   Labs Reviewed  URINALYSIS, ROUTINE W REFLEX MICROSCOPIC - Abnormal; Notable for the following components:      Result Value   APPearance HAZY (*)    Hgb urine dipstick SMALL (*)    Protein, ur 30 (*)    Nitrite POSITIVE (*)    Leukocytes,Ua SMALL (*)    Bacteria, UA RARE (*)    Non Squamous Epithelial 0-5 (*)    All other components within normal limits  BASIC METABOLIC PANEL - Abnormal; Notable for the following components:   Potassium 2.9 (*)    Glucose, Bld 135 (*)    BUN 27 (*)    All other components within normal limits  URINE CULTURE  CBC    Notable for signs of UTI, hypokalemia   Imaging Studies ordered: I ordered imaging studies including CT abd, L and T spine On my interpretation imaging demonstrates multiple thoracic compression fx, rib fx w/o pneumothorax I independently visualized and  interpreted imaging. I agree with the radiologist interpretation   Medicines ordered and prescription drug  management: Meds ordered this encounter  Medications   potassium chloride SA (KLOR-CON M) CR tablet 60 mEq   oxyCODONE-acetaminophen (PERCOCET/ROXICET) 5-325 MG per tablet 2 tablet   fosfomycin (MONUROL) packet 3 g   oxyCODONE (ROXICODONE) 5 MG immediate release tablet    Sig: Take 1 tablet (5 mg total) by mouth every 6 (six) hours as needed for severe pain.    Dispense:  10 tablet    Refill:  0    -I have reviewed the patients home medicines and have made adjustments as needed   Consultations Obtained: I requested consultation with the neurosurgeron,  and discussed lab and imaging findings as well as pertinent plan - they recommend: pain control, outpatient follow up   Cardiac Monitoring: The patient was maintained on a cardiac monitor.  I personally viewed and interpreted the cardiac monitored which showed an underlying rhythm of: NSR  Social Determinants of Health:  Diagnosis or treatment significantly limited by social determinants of health: former smoker   Reevaluation: After the interventions noted above, I reevaluated the patient and found that they have improved  Co morbidities that complicate the patient evaluation  Past Medical History:  Diagnosis Date   Back pain    Constipation 12/2017   COPD (chronic obstructive pulmonary disease) ([place])    Heart murmur    Osteoarthrosis       Dispostion: Disposition decision including need for hospitalization was considered, and patient discharged from emergency department.    Final Clinical Impression(s) / ED Diagnoses Final diagnoses:  Acute cystitis with hematuria  Compression fracture of thoracic vertebra, unspecified thoracic vertebral level, initial encounter ([place])  Closed fracture of multiple ribs, unspecified laterality, initial encounter     This chart was dictated using voice recognition software.  Despite best efforts to proofread,  errors can occur which can change the documentation meaning.     Cristie Hem, MD 11/04/21 Kathyrn Drown

## 2021-11-07 LAB — URINE CULTURE: Culture: >=100000 — AB

## 2021-11-08 ENCOUNTER — Telehealth (HOSPITAL_BASED_OUTPATIENT_CLINIC_OR_DEPARTMENT_OTHER): Payer: Self-pay | Admitting: *Deleted

## 2021-11-08 NOTE — Telephone Encounter (Signed)
Post ED Visit - Positive Culture Follow-up  Culture report reviewed by antimicrobial stewardship pharmacist: [place] Team '[]'$  Elenor Quinones, Pharm.D. '[]'$  Heide Guile, Pharm.D., BCPS AQ-ID '[]'$  Parks Neptune, Pharm.D., BCPS '[]'$  Alycia Rossetti, Pharm.D., BCPS '[]'$  [place], Pharm.D., BCPS, AAHIVP '[]'$  Legrand Como, Pharm.D., BCPS, AAHIVP '[]'$  Salome Arnt, PharmD, BCPS '[]'$  Johnnette Gourd, PharmD, BCPS '[]'$  Hughes Better, PharmD, BCPS '[]'$  Leeroy Cha, PharmD '[]'$  Laqueta Linden, PharmD, BCPS '[]'$  Albertina Parr, PharmD  [place] Team '[]'$  Leodis Sias, PharmD '[]'$  Lindell Spar, PharmD '[]'$  Royetta Asal, PharmD '[]'$  Graylin Shiver, Rph '[]'$  Rema Fendt) Glennon Mac, PharmD '[]'$  Arlyn Dunning, PharmD '[]'$  Netta Cedars, PharmD '[]'$  Dia Sitter, PharmD '[]'$  Leone Haven, PharmD '[]'$  Gretta Arab, PharmD '[]'$  Theodis Shove, PharmD '[]'$  Peggyann Juba, PharmD '[]'$  Reuel Boom, PharmD   Positive urine culture Treated with Fosfomycin x 1 in ED, and no further patient follow-up is required at this time.  Esmeralda Arthur, Pharm D  [facility] Flor Talley 11/08/2021, 12:28 PM

## 2021-11-11 ENCOUNTER — Other Ambulatory Visit: Payer: Self-pay

## 2021-11-11 ENCOUNTER — Emergency Department (HOSPITAL_COMMUNITY)
Admission: EM | Admit: 2021-11-11 | Discharge: 2021-11-11 | Disposition: A | Discharge status: Home / Self Care | Payer: Medicare Other | Attending: Emergency Medicine | Admitting: Emergency Medicine

## 2021-11-11 ENCOUNTER — Encounter (HOSPITAL_COMMUNITY): Payer: Self-pay

## 2021-11-11 ENCOUNTER — Emergency Department (HOSPITAL_COMMUNITY): Payer: Medicare Other

## 2021-11-11 DIAGNOSIS — M549 Dorsalgia, unspecified: Secondary | ICD-10-CM | POA: Insufficient documentation

## 2021-11-11 DIAGNOSIS — M545 Low back pain, unspecified: Secondary | ICD-10-CM

## 2021-11-11 DIAGNOSIS — R0789 Other chest pain: Secondary | ICD-10-CM | POA: Insufficient documentation

## 2021-11-11 MED ORDER — KETOROLAC TROMETHAMINE 15 MG/ML IJ SOLN
15.0000 mg | Freq: Once | INTRAMUSCULAR | Status: AC
  Administered 2021-11-11: 15 mg via INTRAMUSCULAR
  Filled 2021-11-11: qty 1

## 2021-11-11 MED ORDER — MELOXICAM 7.5 MG PO TABS: 7.5000 mg | ORAL_TABLET | Freq: Two times a day (BID) | ORAL | 0 refills | Status: DC

## 2021-11-11 MED ORDER — ACETAMINOPHEN 500 MG PO TABS
1000.0000 mg | ORAL_TABLET | Freq: Once | ORAL | Status: AC
  Administered 2021-11-11: 1000 mg via ORAL
  Filled 2021-11-11: qty 2

## 2021-11-11 NOTE — ED Provider Notes (Signed)
[facility] EMERGENCY DEPARTMENT Provider Note   CSN: [id number] Arrival date & time: 11/11/21  8341     History Chief Complaint  Patient presents with  . Back Pain    HPI Jody [name] is a 81 y.o. female presenting for back pain.  She was seen last week for similar.  She had a fall approximately 2 weeks ago where she got up in the middle the night to go to the bathroom and went to sit on the couch but missed the couch falling directly onto her back.  Last week she was seen to have 3 compression fractures and 3 concurrent rib fractures.  Pain was treated in emergency department and patient was discharged. Since discharge, patient has had some mild symptomatic improvement but is continuing to have back pain.  She has not been using the oxycodone per the son.  They present today for ongoing pain.  No new neurologic symptoms, no worsening of urinary retention episodes.  No fevers chills nausea vomiting syncope shortness of breath.  Patient is ambulatory.  Patient's recorded medical, surgical, social, medication list and allergies were reviewed in the Snapshot window as part of the initial history.   Review of Systems   Review of Systems  Physical Exam Updated Vital Signs BP 133/74   Pulse 96   Temp 98.6 F (37 C) (Oral)   Resp 18   Ht '5\' 3"'$  (1.6 m)   Wt 40.8 kg   SpO2 100%   BMI 15.94 kg/m  Physical Exam Vitals and nursing note reviewed.  Constitutional:      General: She is not in acute distress.    Appearance: She is well-developed.  HENT:     Head: Normocephalic and atraumatic.  Eyes:     Conjunctiva/sclera: Conjunctivae normal.  Cardiovascular:     Rate and Rhythm: Normal rate and regular rhythm.     Heart sounds: No murmur heard. Pulmonary:     Effort: Pulmonary effort is normal. No respiratory distress.     Breath sounds: Normal breath sounds.  Chest:     Chest wall: Tenderness present.  Abdominal:     General: There is no distension.     Palpations:  Abdomen is soft.     Tenderness: There is no abdominal tenderness. There is no right CVA tenderness or left CVA tenderness.  Musculoskeletal:        General: No swelling or tenderness. Normal range of motion.     Cervical back: Neck supple. No tenderness.  Skin:    General: Skin is warm and dry.  Neurological:     General: No focal deficit present.     Mental Status: She is alert and oriented to person, place, and time. Mental status is at baseline.     Cranial Nerves: No cranial nerve deficit.     ED Course/ Medical Decision Making/ A&P    Procedures Procedures   Medications Ordered in ED Medications - No data to display  Medical Decision Making:    Jody [name] is a 81 y.o. female who presented to the ED today with ongoing pain in the setting of a known rib fracture and spinal fractures detailed above.     Patient's presentation is complicated by their history of multiple comorbid medical problems.  Patient placed on continuous vitals and telemetry monitoring while in ED which was reviewed periodically.   Complete initial physical exam performed, notably the patient  was hemodynamically stable in no acute distress patient will take a  deep breath with only mild pain at this time.      Reviewed and confirmed nursing documentation for past medical history, family history, social history.    Initial Assessment:   With the patient's presentation of ongoing pain in setting of rib fractures, most likely diagnosis is acute on chronic pain. Other diagnoses were considered including (but not limited to) new pneumothorax development. These are considered less likely due to history of present illness and physical exam findings.   This is most consistent with an acute life/limb threatening illness complicated by underlying chronic conditions.  Initial Plan:  Had a prolonged conversation with patient and patient's son at bedside.  Ultimately, patient is having ongoing pain in the  setting of history of chronic pain and recent traumatic stressors.  She has arranged follow-up with orthopedics for next week.  She denies fevers or chills, nausea vomiting, syncope shortness of breath.  X-ray performed to evaluate for new intrathoracic pathology including pneumothorax and grossly reassuring at this time. Patient's had no further falls.  Reviewed all of last chart, patient had E. coli that was treated with fosfomycin and is endorsing improvement of her urinary tract infection symptoms.  Her main concern is for ongoing pain control.  Reviewed labs and patient has otherwise healthy kidneys.  She has not gotten benefit from oxycodone.  Favor the patient would likely benefit from nonsteroidal anti-inflammatories despite her age.  Had a shared medical decision making conversation with patient's son and patient at bedside.  They would like to proceed with NSAID therapy at this time.  We will trial in the outpatient setting with plan for patient to follow-up with a PCP within 48 hours.  Phone number given to patient's son to arrange outpatient PCP follow-up.  Patient stable for outpatient care management at this time with no acute indication for further intervention.  Patient discharged with no further acute events.  Clinical Impression: No diagnosis found.   Data Unavailable   Final Clinical Impression(s) / ED Diagnoses Final diagnoses:  None    Rx / DC Orders ED Discharge Orders     None         Tretha Sciara, MD 11/11/21 1521

## 2021-11-11 NOTE — ED Triage Notes (Signed)
Patient states hx of lower back pain fx and states urinary rentention and diarrhea. Patient has hx of chronic back pain, urinary retention and diarrhea. Patient states her linzess is causing these problems.

## 2021-11-13 ENCOUNTER — Telehealth (HOSPITAL_COMMUNITY): Payer: Self-pay | Admitting: Emergency Medicine

## 2021-11-13 MED ORDER — MELOXICAM 7.5 MG PO TABS: 7.5000 mg | ORAL_TABLET | Freq: Two times a day (BID) | ORAL | 0 refills | Status: AC

## 2021-11-13 NOTE — Telephone Encounter (Signed)
Called by son he states that meloxicam prescription was not available at the pharmacy that they had tried to pick it up from.  Provided updated pharmacy and will send the meloxicam there.

## 2022-01-31 ENCOUNTER — Emergency Department (HOSPITAL_COMMUNITY)
Admission: EM | Admit: 2022-01-31 | Discharge: 2022-01-31 | Disposition: A | Discharge status: Home / Self Care | Payer: Medicare Other | Attending: Emergency Medicine | Admitting: Emergency Medicine

## 2022-01-31 ENCOUNTER — Encounter (HOSPITAL_COMMUNITY): Payer: Self-pay

## 2022-01-31 ENCOUNTER — Other Ambulatory Visit: Payer: Self-pay

## 2022-01-31 DIAGNOSIS — R300 Dysuria: Secondary | ICD-10-CM | POA: Diagnosis present

## 2022-01-31 DIAGNOSIS — J449 Chronic obstructive pulmonary disease, unspecified: Secondary | ICD-10-CM | POA: Diagnosis not present

## 2022-01-31 DIAGNOSIS — N3000 Acute cystitis without hematuria: Secondary | ICD-10-CM | POA: Diagnosis not present

## 2022-01-31 LAB — URINALYSIS, ROUTINE W REFLEX MICROSCOPIC
Bilirubin Urine: NEGATIVE
Glucose, UA: NEGATIVE mg/dL
Hgb urine dipstick: NEGATIVE
Ketones, ur: NEGATIVE mg/dL
Nitrite: POSITIVE — AB
Protein, ur: NEGATIVE mg/dL
Specific Gravity, Urine: 1.017 (ref 1.005–1.030)
pH: 5 (ref 5.0–8.0)

## 2022-01-31 LAB — COMPREHENSIVE METABOLIC PANEL
ALT: 11 U/L (ref 0–44)
AST: 18 U/L (ref 15–41)
Albumin: 3.6 g/dL (ref 3.5–5.0)
Alkaline Phosphatase: 87 U/L (ref 38–126)
Anion gap: 6 (ref 5–15)
BUN: 24 mg/dL — ABNORMAL HIGH (ref 8–23)
CO2: 28 mmol/L (ref 22–32)
Calcium: 9.1 mg/dL (ref 8.9–10.3)
Chloride: 105 mmol/L (ref 98–111)
Creatinine, Ser: 0.74 mg/dL (ref 0.44–1.00)
GFR, Estimated: >60 mL/min (ref >60)
Glucose, Bld: 91 mg/dL (ref 70–99)
Potassium: 4.3 mmol/L (ref 3.5–5.1)
Sodium: 139 mmol/L (ref 135–145)
Total Bilirubin: 0.1 mg/dL — ABNORMAL LOW (ref 0.3–1.2)
Total Protein: 6.6 g/dL (ref 6.5–8.1)

## 2022-01-31 LAB — CBC WITH DIFFERENTIAL/PLATELET
Abs Immature Granulocytes: 0 10*3/uL (ref 0.00–0.07)
Basophils Absolute: 0 10*3/uL (ref 0.0–0.1)
Basophils Relative: 1 %
Eosinophils Absolute: 0.1 10*3/uL (ref 0.0–0.5)
Eosinophils Relative: 3 %
HCT: 40.8 % (ref 36.0–46.0)
Hemoglobin: 12.6 g/dL (ref 12.0–15.0)
Immature Granulocytes: 0 %
Lymphocytes Relative: 38 %
Lymphs Abs: 1.6 10*3/uL (ref 0.7–4.0)
MCH: 31 pg (ref 26.0–34.0)
MCHC: 30.9 g/dL (ref 30.0–36.0)
MCV: 100.5 fL — ABNORMAL HIGH (ref 80.0–100.0)
Monocytes Absolute: 0.4 10*3/uL (ref 0.1–1.0)
Monocytes Relative: 10 %
Neutro Abs: 2 10*3/uL (ref 1.7–7.7)
Neutrophils Relative %: 48 %
Platelets: 174 10*3/uL (ref 150–400)
RBC: 4.06 MIL/uL (ref 3.87–5.11)
RDW: 14.9 % (ref 11.5–15.5)
WBC: 4.2 10*3/uL (ref 4.0–10.5)
nRBC: 0 % (ref 0.0–0.2)

## 2022-01-31 MED ORDER — CEFPODOXIME PROXETIL 200 MG PO TABS: 200.0000 mg | ORAL_TABLET | Freq: Two times a day (BID) | ORAL | 0 refills | Status: AC

## 2022-01-31 MED ORDER — GENTAMICIN SULFATE 40 MG/ML IJ SOLN
5.0000 mg/kg | Freq: Once | INTRAVENOUS | Status: DC
  Filled 2022-01-31: qty 5

## 2022-01-31 MED ORDER — ONDANSETRON HCL 4 MG PO TABS: 4.0000 mg | ORAL_TABLET | Freq: Four times a day (QID) | ORAL | 0 refills | Status: AC

## 2022-01-31 NOTE — ED Triage Notes (Signed)
Pt reports she self caths during the day and urinates at night without knowing it for the past 2 years. Pt reports she has burning "down there" now and want to be checked for a UTI.

## 2022-01-31 NOTE — ED Provider Notes (Signed)
[facility] EMERGENCY DEPARTMENT Provider Note   CSN: [id number] Arrival date & time: 01/31/22  1426     History Chief Complaint  Patient presents with   Dysuria    Jody [name] is a 82 y.o. female with history of COPD, urinary retention, self cath, presents the emergency room today for evaluation of burning with urination.  She reports this been going on for the past 2 days.  She reports that she is been having to self catheter self for the past few months and she is seen multiple doctors for this reason, she reports that she was not told of why this is happening to her.  She has not had any abdominal pain, nausea, vomiting, or fevers.  She reports that her urine is foul-smelling as well.  The patient reports that she sees a lot of doctors, and thinks one of them is a urologist.  She reports that she is already "had a bladder scan done before".  She reports that she has been having some urinary incontinence for the past 2 years for which no one can tell her why she is having this.   Dysuria Associated symptoms: no abdominal pain, no fever, no nausea and no vomiting        Home Medications Prior to Admission medications   Medication Sig Start Date End Date Taking? Authorizing Provider  alfuzosin (UROXATRAL) 10 MG 24 hr tablet Take 1 tablet (10 mg total) by mouth daily with breakfast. 06/27/21   Franchot Gallo, MD  Cholecalciferol (VITAMIN D) 50 MCG (2000 UT) tablet Take 2,000 Units by mouth daily.    [provider]  docusate sodium (COLACE) 100 MG capsule Take 1 capsule (100 mg total) by mouth every 12 (twelve) hours. 06/23/21   Davonna Belling, MD  doxycycline (VIBRAMYCIN) 100 MG capsule Take 1 capsule (100 mg total) by mouth every 12 (twelve) hours. 07/19/21   Summerlin, Berneice Heinrich, PA-C  estradiol (ESTRACE) 0.1 MG/GM vaginal cream Apply a pea size amount of cream to urethral area of vagina twice weekly 07/06/21   Summerlin, Cristal Ford Annette, PA-C  mirtazapine  (REMERON) 15 MG tablet Take 15 mg by mouth at bedtime. 06/29/21   [provider]  omeprazole (PRILOSEC) 40 MG capsule Take 40 mg by mouth daily. 06/29/21   [provider]  ondansetron (ZOFRAN) 4 MG tablet Take 1 tablet (4 mg total) by mouth every 6 (six) hours. 06/23/21   Davonna Belling, MD  ondansetron (ZOFRAN) 4 MG tablet Take 1 tablet (4 mg total) by mouth every 6 (six) hours. 08/13/21   Tonye Pearson, PA-C  ondansetron (ZOFRAN-ODT) 4 MG disintegrating tablet '4mg'$  ODT q4 hours prn nausea/vomit 06/23/21   Mesner, Corene Cornea, MD  oxyCODONE (ROXICODONE) 5 MG immediate release tablet Take 1 tablet (5 mg total) by mouth every 6 (six) hours as needed for severe pain. 11/04/21   Cristie Hem, MD  polyethylene glycol (MIRALAX) 17 g packet Take 17 g by mouth daily. 07/14/21   Fransico Meadow, PA-C  potassium chloride SA (KLOR-CON M) 20 MEQ tablet Take 2 tablets (40 mEq total) by mouth 2 (two) times daily. 06/23/21   Davonna Belling, MD  tamsulosin (FLOMAX) 0.4 MG CAPS capsule Take 0.4 mg by mouth daily. 06/29/21   [provider]      Allergies    Contrast media [iodinated contrast media], Barium-containing compounds, Cephalosporins, Ciprofloxacin, Sulfa antibiotics, Brompheniramine, Ceclor [cefaclor], Codeine, Dimetapp [albertsons di bromm], Penicillins, Phenylpropanolamine, Latex, and Other    Review of  Systems   Review of Systems  Constitutional:  Negative for chills and fever.  Respiratory:  Negative for shortness of breath.   Cardiovascular:  Negative for chest pain.  Gastrointestinal:  Negative for abdominal pain, nausea and vomiting.  Genitourinary:  Positive for dysuria.       Reports foul-smelling urine    Physical Exam Updated Vital Signs BP (!) 113/57   Pulse 94   Ht '5\' 3"'$  (1.6 m)   Wt 40.8 kg   SpO2 94%   BMI 15.93 kg/m  Physical Exam Constitutional:      General: She is not in acute distress.    Appearance: Normal appearance. She is not  toxic-appearing.     Comments: Cachectic appearing  Eyes:     General: No scleral icterus. Cardiovascular:     Rate and Rhythm: Normal rate.  Pulmonary:     Effort: Pulmonary effort is normal. No respiratory distress.  Abdominal:     Palpations: Abdomen is soft.     Tenderness: There is no abdominal tenderness. There is right CVA tenderness and left CVA tenderness. There is no guarding or rebound.     Comments: Question if this is CVA tenderness or her chronic back pain  Skin:    General: Skin is dry.     Findings: No rash.  Neurological:     General: No focal deficit present.     Mental Status: She is alert. Mental status is at baseline.  Psychiatric:        Mood and Affect: Mood normal.     ED Results / Procedures / Treatments   Labs (all labs ordered are listed, but only abnormal results are displayed) Labs Reviewed  CBC WITH DIFFERENTIAL/PLATELET - Abnormal; Notable for the following components:      Result Value   MCV 100.5 (*)    All other components within normal limits  COMPREHENSIVE METABOLIC PANEL - Abnormal; Notable for the following components:   BUN 24 (*)    Total Bilirubin 0.1 (*)    All other components within normal limits  URINALYSIS, ROUTINE W REFLEX MICROSCOPIC - Abnormal; Notable for the following components:   Nitrite POSITIVE (*)    Leukocytes,Ua TRACE (*)    Bacteria, UA FEW (*)    All other components within normal limits    EKG None  Radiology No results found.  Procedures Procedures   Medications Ordered in ED Medications - No data to display  ED Course/ Medical Decision Making/ A&P                           Medical Decision Making Amount and/or Complexity of Data Reviewed Labs: ordered.  Risk Prescription drug management.   82 year old female presents the emergency room today for evaluation of dysuria for the past 2 days.  Differential diagnosis includes but is not limited to UTI versus cystitis versus pyelonephritis versus  urethritis versus interstitial cystitis.  Vital signs are unremarkable.  Patient normotensive, febrile, no pulse rate, satting well room air without increased work of breathing.  Physical exam as noted above.  Labs ordered.  I independently reviewed and interpreted the patient's labs.  BC without leukocytosis or anemia.  CMP shows mildly elevated BUN at 24.  Total bili at 0.1 otherwise no electrolyte or LFT abnormality.  Creatinine at 0.74.  Urinalysis does show positive nitrites with trace leukocytes.  There is 6-10 white blood cells with few bacteria present.  Given the patient's history  of self cath, this is likely positive for UTI.  On chart evaluation, and it seems that patient was given fosfomycin back in October.  Given her urine sensitivity as well as her multiple allergies to antibiotics.  I did consult pharmacy.  I spoke with pharmacist Colletta Maryland who investigated the chart.  She gave me a call back and recommended gentamicin IV.  I discussed the patient's multiple drug allergies as well as the need to give her an antibiotic for presumed UTI.  She was agreeable to doing the IV medication for gentamicin.  She reports that they usually give her an oral medication.  I stressed with her that this may not help as it did not help her previously as she just had a UTI in the months prior.  Was informed by nursing the patient no longer wants to wait for the gentamicin.  She is unhappy with her wait times and would like to go home.  I reconsulted up to pharmacy to see if there is any oral medications we can give her to send home for UTI.  Even though it mentions that she has had a cephalosporin allergy for the past 10 years, the patient was given a cephalosporin in June.  I asked the patient if she had any adverse reactions to the medication she was given in June for UTI which she replied to as no.  Will send her home with cefpodoxime to take twice daily for the next 7 days.  Although it does mention her  allergy chart that she does have some nausea and vomiting with this medication, I did go ahead and send her home with some Zofran as well.  We discussed return precaution reflux symptoms.  Patient is being discharged home.   Final Clinical Impression(s) / ED Diagnoses Final diagnoses:  Acute cystitis without hematuria    Rx / DC Orders ED Discharge Orders          Ordered    cefpodoxime (VANTIN) 200 MG tablet  2 times daily        01/31/22 1836    ondansetron (ZOFRAN) 4 MG tablet  Every 6 hours        01/31/22 1837              Sherrell Puller, PA-C 02/01/22 0110    Jeanell Sparrow, DO 02/03/22 2221

## 2022-01-31 NOTE — Discharge Instructions (Signed)
You were seen in the ER today for evaluation of your painful with urination.  Your urine did show signs of urinary tract infection.  You have refused to get the IV medications.  We are sending you home with some oral medications.  If you start get nauseous or vomiting, please take your Zofran with this.  If you have any abdominal pain, nausea, vomiting, fever, worsening pain with urination, blood in your urine, please turn to the nurse for department for evaluation.  Otherwise, please make sure you are following with your urologist and your primary care doctor.  Contact a health care provider if: Your symptoms do not get better after 1-2 days. Your symptoms go away and then return. Get help right away if: You have severe pain in your back or your lower abdomen. You have a fever or chills. You have nausea or vomiting.

## 2022-01-31 NOTE — ED Notes (Signed)
Pt d/c home per MD order. Discharge summary reviewed with pt, pt verbalizes understanding. Discharged home with son

## 2022-03-07 ENCOUNTER — Encounter

## 2022-03-17 ENCOUNTER — Other Ambulatory Visit: Payer: Self-pay

## 2022-03-17 ENCOUNTER — Encounter (HOSPITAL_COMMUNITY): Payer: Self-pay

## 2022-03-17 ENCOUNTER — Emergency Department (HOSPITAL_COMMUNITY)
Admission: EM | Admit: 2022-03-17 | Discharge: 2022-03-17 | Disposition: A | Discharge status: Home / Self Care | Payer: Medicare Other | Attending: Student | Admitting: Student

## 2022-03-17 ENCOUNTER — Emergency Department (HOSPITAL_COMMUNITY): Payer: Medicare Other

## 2022-03-17 DIAGNOSIS — N3080 Other cystitis without hematuria: Secondary | ICD-10-CM

## 2022-03-17 DIAGNOSIS — X58XXXA Exposure to other specified factors, initial encounter: Secondary | ICD-10-CM | POA: Insufficient documentation

## 2022-03-17 DIAGNOSIS — S32029A Unspecified fracture of second lumbar vertebra, initial encounter for closed fracture: Secondary | ICD-10-CM | POA: Diagnosis not present

## 2022-03-17 DIAGNOSIS — Z91040 Latex allergy status: Secondary | ICD-10-CM | POA: Insufficient documentation

## 2022-03-17 DIAGNOSIS — N3000 Acute cystitis without hematuria: Secondary | ICD-10-CM | POA: Diagnosis not present

## 2022-03-17 DIAGNOSIS — J449 Chronic obstructive pulmonary disease, unspecified: Secondary | ICD-10-CM | POA: Insufficient documentation

## 2022-03-17 DIAGNOSIS — S3992XA Unspecified injury of lower back, initial encounter: Secondary | ICD-10-CM | POA: Diagnosis present

## 2022-03-17 LAB — URINALYSIS, ROUTINE W REFLEX MICROSCOPIC
Bilirubin Urine: NEGATIVE
Glucose, UA: NEGATIVE mg/dL
Hgb urine dipstick: NEGATIVE
Ketones, ur: NEGATIVE mg/dL
Nitrite: POSITIVE — AB
Protein, ur: NEGATIVE mg/dL
Specific Gravity, Urine: 1.016 (ref 1.005–1.030)
pH: 6 (ref 5.0–8.0)

## 2022-03-17 LAB — CBC WITH DIFFERENTIAL/PLATELET
Abs Immature Granulocytes: 0.01 10*3/uL (ref 0.00–0.07)
Basophils Absolute: 0 10*3/uL (ref 0.0–0.1)
Basophils Relative: 1 %
Eosinophils Absolute: 0.1 10*3/uL (ref 0.0–0.5)
Eosinophils Relative: 1 %
HCT: 39.4 % (ref 36.0–46.0)
Hemoglobin: 12.7 g/dL (ref 12.0–15.0)
Immature Granulocytes: 0 %
Lymphocytes Relative: 24 %
Lymphs Abs: 1.6 10*3/uL (ref 0.7–4.0)
MCH: 32.1 pg (ref 26.0–34.0)
MCHC: 32.2 g/dL (ref 30.0–36.0)
MCV: 99.5 fL (ref 80.0–100.0)
Monocytes Absolute: 0.4 10*3/uL (ref 0.1–1.0)
Monocytes Relative: 6 %
Neutro Abs: 4.6 10*3/uL (ref 1.7–7.7)
Neutrophils Relative %: 68 %
Platelets: 196 10*3/uL (ref 150–400)
RBC: 3.96 MIL/uL (ref 3.87–5.11)
RDW: 13.7 % (ref 11.5–15.5)
WBC: 6.7 10*3/uL (ref 4.0–10.5)
nRBC: 0 % (ref 0.0–0.2)

## 2022-03-17 LAB — BASIC METABOLIC PANEL
Anion gap: 4 — ABNORMAL LOW (ref 5–15)
BUN: 22 mg/dL (ref 8–23)
CO2: 26 mmol/L (ref 22–32)
Calcium: 8.6 mg/dL — ABNORMAL LOW (ref 8.9–10.3)
Chloride: 105 mmol/L (ref 98–111)
Creatinine, Ser: 0.77 mg/dL (ref 0.44–1.00)
GFR, Estimated: >60 mL/min (ref >60)
Glucose, Bld: 117 mg/dL — ABNORMAL HIGH (ref 70–99)
Potassium: 4 mmol/L (ref 3.5–5.1)
Sodium: 135 mmol/L (ref 135–145)

## 2022-03-17 LAB — LIPASE, BLOOD: Lipase: 30 U/L (ref 11–51)

## 2022-03-17 MED ORDER — KETOROLAC TROMETHAMINE 15 MG/ML IJ SOLN
15.0000 mg | Freq: Once | INTRAMUSCULAR | Status: AC
  Administered 2022-03-17: 15 mg via INTRAVENOUS
  Filled 2022-03-17: qty 1

## 2022-03-17 MED ORDER — NAPROXEN 500 MG PO TABS: 500.0000 mg | ORAL_TABLET | Freq: Two times a day (BID) | ORAL | 0 refills | Status: AC

## 2022-03-17 MED ORDER — FOSFOMYCIN TROMETHAMINE 3 G PO PACK
3.0000 g | PACK | Freq: Once | ORAL | Status: AC
  Administered 2022-03-17: 3 g via ORAL
  Filled 2022-03-17: qty 3

## 2022-03-17 NOTE — ED Triage Notes (Signed)
Pt states she has had right sided flank pain x3 days. Is unable to urinate & has to do daily caths.

## 2022-03-17 NOTE — ED Notes (Signed)
In and out cath performed with 400cc urine output.

## 2022-03-17 NOTE — ED Notes (Signed)
Discharge instructions discussed with pt and pt's son at bedside. Pt is only being cathed Mon-Fri by her daughter who works for home health agency. Instructed pt to ask her daughter to cath her on the weekends as well to help prevent UTIs.  Pt verbalized understanding. Pt instructed to f/u with PCP, Dr. Arnoldo Morale, and gastroenterologist per her request. Needs to call PCP on Monday and inform them she was seen and schedule f/u.  Pt and son verbalized understanding.  Pt assisted into personal wheelchair, able to walk w/assistance from staff x 2.

## 2022-03-17 NOTE — Discharge Instructions (Addendum)
You were diagnosed today with urinary tract infection and what is possibly a new fracture of the L2 vertebrae.  You were given medication today while here in the emergency room to treat the urinary tract infection.  I have prescribed Naprosyn to help with inflammation and pain.  Please follow-up with your primary care provider.  If you develop any life-threatening symptoms please return to the emergency department.

## 2022-03-17 NOTE — ED Notes (Signed)
Pt returned from CT scan.

## 2022-03-17 NOTE — ED Provider Notes (Signed)
[facility] Provider Note   CSN: [id number] Arrival date & time: 03/17/22  1803     History  Chief Complaint  Patient presents with   Flank Pain    Jody [name] is a 82 y.o. female.  Patient presents emergency room complaining of right lower quadrant abdominal pain.  Patient endorses sharp pain in the area along with right-sided flank pain for the past 3 days.  She states that she is unable to urinate on her own and has to do daily caths.  Her daughter comes to the home Monday through Friday and performs caths 3-4 times daily.  Over the weekend the patient's son is uncomfortable with caths and the patient has no one to provide catheterization.  She is occasionally incontinent over the weekend.  She denies nausea, vomiting, chest pain, shortness of breath.  She does endorse history of intermittent constipation which has been evaluated by outside providers.  She has been prescribed Linzess in the past but states that she did not tolerate the medication.  These complaints are currently being followed by her primary care provider.  Past medical history significant for constipation, osteoarthritis, COPD, heart murmur, urinary retention  HPI     Home Medications Prior to Admission medications   Medication Sig Start Date End Date Taking? Authorizing Provider  naproxen (NAPROSYN) 500 MG tablet Take 1 tablet (500 mg total) by mouth 2 (two) times daily. 03/17/22  Yes Dorothyann Peng, PA-C  alfuzosin (UROXATRAL) 10 MG 24 hr tablet Take 1 tablet (10 mg total) by mouth daily with breakfast. 06/27/21   Franchot Gallo, MD  Cholecalciferol (VITAMIN D) 50 MCG (2000 UT) tablet Take 2,000 Units by mouth daily.    [provider]  docusate sodium (COLACE) 100 MG capsule Take 1 capsule (100 mg total) by mouth every 12 (twelve) hours. 06/23/21   Davonna Belling, MD  doxycycline (VIBRAMYCIN) 100 MG capsule Take 1 capsule (100 mg total) by mouth every 12  (twelve) hours. 07/19/21   Summerlin, Berneice Heinrich, PA-C  estradiol (ESTRACE) 0.1 MG/GM vaginal cream Apply a pea size amount of cream to urethral area of vagina twice weekly 07/06/21   Summerlin, Cristal Ford Annette, PA-C  mirtazapine (REMERON) 15 MG tablet Take 15 mg by mouth at bedtime. 06/29/21   [provider]  omeprazole (PRILOSEC) 40 MG capsule Take 40 mg by mouth daily. 06/29/21   [provider]  ondansetron (ZOFRAN) 4 MG tablet Take 1 tablet (4 mg total) by mouth every 6 (six) hours. 01/31/22   Sherrell Puller, PA-C  oxyCODONE (ROXICODONE) 5 MG immediate release tablet Take 1 tablet (5 mg total) by mouth every 6 (six) hours as needed for severe pain. 11/04/21   Cristie Hem, MD  polyethylene glycol (MIRALAX) 17 g packet Take 17 g by mouth daily. 07/14/21   Fransico Meadow, PA-C  potassium chloride SA (KLOR-CON M) 20 MEQ tablet Take 2 tablets (40 mEq total) by mouth 2 (two) times daily. 06/23/21   Davonna Belling, MD  tamsulosin (FLOMAX) 0.4 MG CAPS capsule Take 0.4 mg by mouth daily. 06/29/21   [provider]      Allergies    Contrast media [iodinated contrast media], Barium-containing compounds, Cephalosporins, Ciprofloxacin, Sulfa antibiotics, Brompheniramine, Ceclor [cefaclor], Codeine, Dimetapp [albertsons di bromm], Penicillins, Phenylpropanolamine, Latex, and Other    Review of Systems   Review of Systems  Constitutional:  Negative for fever.  Respiratory:  Negative for shortness of breath.  Gastrointestinal:  Positive for abdominal pain. Negative for nausea and vomiting.  Genitourinary:  Positive for dysuria and flank pain.    Physical Exam Updated Vital Signs BP 133/78   Pulse 97   Temp 98.3 F (36.8 C) (Oral)   Resp 16   Ht '5\' 7"'$  (1.702 m)   Wt 44 kg   SpO2 92%   BMI 15.19 kg/m  Physical Exam Vitals and nursing note reviewed.  Constitutional:      General: She is not in acute distress.    Appearance: She is well-developed.  HENT:      Head: Normocephalic and atraumatic.  Eyes:     Conjunctiva/sclera: Conjunctivae normal.  Cardiovascular:     Rate and Rhythm: Normal rate and regular rhythm.     Heart sounds: No murmur heard. Pulmonary:     Effort: Pulmonary effort is normal. No respiratory distress.     Breath sounds: Normal breath sounds.  Abdominal:     Palpations: Abdomen is soft.     Tenderness: There is abdominal tenderness (Right lower quadrant). There is no right CVA tenderness or left CVA tenderness.  Musculoskeletal:        General: No swelling.     Cervical back: Neck supple.  Skin:    General: Skin is warm and dry.     Capillary Refill: Capillary refill takes less than 2 seconds.  Neurological:     Mental Status: She is alert.  Psychiatric:        Mood and Affect: Mood normal.     ED Results / Procedures / Treatments   Labs (all labs ordered are listed, but only abnormal results are displayed) Labs Reviewed  BASIC METABOLIC PANEL - Abnormal; Notable for the following components:      Result Value   Glucose, Bld 117 (*)    Calcium 8.6 (*)    Anion gap 4 (*)    All other components within normal limits  URINALYSIS, ROUTINE W REFLEX MICROSCOPIC - Abnormal; Notable for the following components:   APPearance HAZY (*)    Nitrite POSITIVE (*)    Leukocytes,Ua SMALL (*)    Bacteria, UA RARE (*)    All other components within normal limits  CBC WITH DIFFERENTIAL/PLATELET  LIPASE, BLOOD    EKG None  Radiology CT ABDOMEN PELVIS WO CONTRAST  Result Date: 03/17/2022 CLINICAL DATA:  Abdominal pain. EXAM: CT ABDOMEN AND PELVIS WITHOUT CONTRAST TECHNIQUE: Multidetector CT imaging of the abdomen and pelvis was performed following the standard protocol without IV contrast. RADIATION DOSE REDUCTION: This exam was performed according to the departmental dose-optimization program which includes automated exposure control, adjustment of the mA and/or kV according to patient size and/or use of iterative  reconstruction technique. COMPARISON:  CT abdomen pelvis dated 11/18/2021. FINDINGS: Evaluation of this exam is limited in the absence of intravenous contrast. Lower chest: Bibasilar subpleural atelectasis/scarring, left greater than right. There is coronary vascular calcification. No intra-abdominal free air or free fluid. Hepatobiliary: Small liver cysts. No imaging follow-up. The liver is otherwise unremarkable. No biliary dilatation. The gallbladder is unremarkable. Pancreas: The pancreas is unremarkable as visualized. Spleen: Normal in size without focal abnormality. Adrenals/Urinary Tract: The adrenal glands are unremarkable. There is an 8 mm nonobstructing right renal inferior pole calculus. No hydronephrosis. There is no hydronephrosis or nephrolithiasis on the left. Small left renal parapelvic cysts. There is mild right renal parenchyma atrophy. The urinary bladder is grossly unremarkable. Stomach/Bowel: Postsurgical changes of the bowel with anastomotic suture in the  sigmoid colon. There is no bowel obstruction or active inflammation. The appendix is not visualized, likely surgically absent. Vascular/Lymphatic: Advanced aortoiliac atherosclerotic disease. The IVC is unremarkable. No portal venous gas. There is no adenopathy. Reproductive: The uterus is grossly unremarkable. No adnexal masses. Other: None Musculoskeletal: Osteopenia with degenerative changes of the spine. Old L3 compression fracture and vertebroplasty. There is mild compression fracture of the superior endplate of L2 with approximately 10-15% loss of vertebral body height, new since the prior CT and likely acute or subacute. Correlation with clinical exam and point tenderness recommended. IMPRESSION: 1. Mild compression fracture of the superior endplate of L2, new since the prior CT and likely acute or subacute. Correlation with clinical exam and point tenderness recommended. 2. A 8 mm nonobstructing right renal inferior pole calculus. No  hydronephrosis. 3. No bowel obstruction. 4.  Aortic Atherosclerosis (ICD10-I70.0). Electronically Signed   By: Anner Crete M.D.   On: 03/17/2022 20:27    Procedures Procedures    Medications Ordered in ED Medications  ketorolac (TORADOL) 15 MG/ML injection 15 mg (15 mg Intravenous Given 03/17/22 1932)  fosfomycin (MONUROL) packet 3 g (3 g Oral Given 03/17/22 2046)    ED Course/ Medical Decision Making/ A&P                             Medical Decision Making Amount and/or Complexity of Data Reviewed Labs: ordered. Radiology: ordered.  Risk Prescription drug management.   This patient presents to the ED for concern of right lower quadrant pain and dysuria, this involves an extensive number of treatment options, and is a complaint that carries with it a high risk of complications and morbidity.  The differential diagnosis includes nephrolithiasis, pyelonephritis, cystitis, appendicitis, others   Co morbidities that complicate the patient evaluation  Patient requires daily in and out caths   Additional history obtained:  Additional history obtained from patient's son at bedside External records from outside source obtained and reviewed including emergency department notes from January 17 with the patient was diagnosed with acute cystitis   Lab Tests:  I Ordered, and personally interpreted labs.  The pertinent results include: Grossly unremarkable CBC, BMP, lipase.  UA positive for nitrite, small leukocytes, rare bacteria   Imaging Studies ordered:  I ordered imaging studies including CT abdomen pelvis without contrast  I independently visualized and interpreted imaging which showed  1. Mild compression fracture of the superior endplate of L2, new  since the prior CT and likely acute or subacute. Correlation with  clinical exam and point tenderness recommended.  2. A 8 mm nonobstructing right renal inferior pole calculus. No  hydronephrosis.  3. No bowel obstruction.   4.  Aortic Atherosclerosis   I agree with the radiologist interpretation   Problem List / ED Course / Critical interventions / Medication management   I ordered medication including Toradol for pain, fosfomycin for UTI Reevaluation of the patient after these medicines showed that the patient improved I have reviewed the patients home medicines and have made adjustments as needed   Social Determinants of Health:  Patient lives at home, has family members providing care.  No one providing in and out catheter procedure on weekends   Test / Admission - Considered:  The patient has urine suspicious for UTI.  She also has what appears to be a possible new superior endplate fracture at L2.  I feel this is likely the cause of the patient's pain.  Patient given fosfomycin to treat for UTI.  Plan to discharge home with prescription for anti-inflammatory medications and recommendations for follow-up with primary care.         Final Clinical Impression(s) / ED Diagnoses Final diagnoses:  Closed fracture of second lumbar vertebra, unspecified fracture morphology, initial encounter ([place])  Acute bacterial simple cystitis    Rx / DC Orders ED Discharge Orders          Ordered    naproxen (NAPROSYN) 500 MG tablet  2 times daily        03/17/22 2122              Ronny Bacon 03/17/22 2123    Teressa Lower, MD 03/17/22 2223

## 2022-03-17 NOTE — ED Triage Notes (Signed)
Formatting of this note might be different from the original.  Pt states she has had right sided flank pain x3 days. Is unable to urinate & has to do daily caths.    Electronically signed by Victorio Palm, RN at 03/17/2022  6:24 PM EST

## 2022-03-17 NOTE — ED Notes (Signed)
Formatting of this note might be different from the original.  Discharge instructions discussed with Kimberly Sherman and Kimberly Sherman's son at bedside. Kimberly Sherman is only being cathed Mon-Fri by her daughter who works for home health agency. Instructed Kimberly Sherman to ask her daughter to cath her on the weekends as well to help prevent UTIs.  Kimberly Sherman verbalized understanding. Kimberly Sherman instructed to f/u with PCP, Dr. Arnoldo Morale, and gastroenterologist per her request. Needs to call PCP on Monday and inform them she was seen and schedule f/u.  Kimberly Sherman and son verbalized understanding.  Kimberly Sherman assisted into personal wheelchair, able to walk w/assistance from staff x 2.   Electronically signed by Sharman Cheek, RN at 03/17/2022  9:47 PM EST

## 2022-03-17 NOTE — ED Notes (Signed)
Formatting of this note might be different from the original.  Pt returned from CT scan  Electronically signed by Sharman Cheek, RN at 03/17/2022  8:21 PM EST

## 2022-03-17 NOTE — ED Notes (Signed)
Formatting of this note might be different from the original.  In and out cath performed with 400cc urine output.   Electronically signed by Eartha Inch, RN at 03/17/2022  6:50 PM EST

## 2022-03-17 NOTE — ED Provider Notes (Signed)
Formatting of this note is different from the original.  Images from the original note were not included.    [facility]  Provider Note    CSN: [id number]  Arrival date & time: 03/17/22  1803        History    Chief Complaint   Patient presents with    Flank Pain     Kimberly Sherman is a 82 y.o. female.  Patient presents emergency room complaining of right lower quadrant abdominal pain.  Patient endorses sharp pain in the area along with right-sided flank pain for the past 3 days.  She states that she is unable to urinate on her own and has to do daily caths.  Her daughter comes to the home Monday through Friday and performs caths 3-4 times daily.  Over the weekend the patient's son is uncomfortable with caths and the patient has no one to provide catheterization.  She is occasionally incontinent over the weekend.  She denies nausea, vomiting, chest pain, shortness of breath.  She does endorse history of intermittent constipation which has been evaluated by outside providers.  She has been prescribed Linzess in the past but states that she did not tolerate the medication.  These complaints are currently being followed by her primary care provider.  Past medical history significant for constipation, osteoarthritis, COPD, heart murmur, urinary retention    HPI        Home Medications  Prior to Admission medications    Medication Sig Start Date End Date Taking? Authorizing Provider   naproxen (NAPROSYN) 500 MG tablet Take 1 tablet (500 mg total) by mouth 2 (two) times daily. 03/17/22  Yes Dorothyann Peng, PA-C   alfuzosin (UROXATRAL) 10 MG 24 hr tablet Take 1 tablet (10 mg total) by mouth daily with breakfast. 06/27/21   Franchot Gallo, MD   Cholecalciferol (VITAMIN D) 50 MCG (2000 UT) tablet Take 2,000 Units by mouth daily.    [provider]   docusate sodium (COLACE) 100 MG capsule Take 1 capsule (100 mg total) by mouth every 12 (twelve) hours. 06/23/21   Davonna Belling, MD   doxycycline (VIBRAMYCIN) 100 MG capsule Take 1 capsule (100 mg total) by mouth every 12 (twelve) hours. 07/19/21   Summerlin, Berneice Heinrich, PA-C   estradiol (ESTRACE) 0.1 MG/GM vaginal cream Apply a pea size amount of cream to urethral area of vagina twice weekly 07/06/21   Summerlin, Cristal Ford Annette, PA-C   mirtazapine (REMERON) 15 MG tablet Take 15 mg by mouth at bedtime. 06/29/21   [provider]   omeprazole (PRILOSEC) 40 MG capsule Take 40 mg by mouth daily. 06/29/21   [provider]   ondansetron (ZOFRAN) 4 MG tablet Take 1 tablet (4 mg total) by mouth every 6 (six) hours. 01/31/22   Sherrell Puller, PA-C   oxyCODONE (ROXICODONE) 5 MG immediate release tablet Take 1 tablet (5 mg total) by mouth every 6 (six) hours as needed for severe pain. 11/04/21   Cristie Hem, MD   polyethylene glycol (MIRALAX) 17 g packet Take 17 g by mouth daily. 07/14/21   Fransico Meadow, PA-C   potassium chloride SA (KLOR-CON M) 20 MEQ tablet Take 2 tablets (40 mEq total) by mouth 2 (two) times daily. 06/23/21   Davonna Belling, MD   tamsulosin (FLOMAX) 0.4 MG CAPS capsule Take 0.4 mg by mouth daily. 06/29/21   [provider]       Allergies  Contrast media [iodinated contrast media], Barium-containing compounds, Cephalosporins, Ciprofloxacin, Sulfa antibiotics, Brompheniramine, Ceclor [cefaclor], Codeine, Dimetapp [albertsons di bromm], Penicillins, Phenylpropanolamine, Latex, and Other      Review of Systems    Review of Systems   Constitutional:  Negative for fever.   Respiratory:  Negative for shortness of breath.    Gastrointestinal:  Positive for abdominal pain. Negative for nausea and vomiting.   Genitourinary:  Positive for dysuria and flank pain.     Physical Exam  Updated Vital Signs  BP 133/78   Pulse 97   Temp 98.3 F (36.8 C) (Oral)   Resp 16   Ht 5\' 7"  (1.702 m)   Wt 44 kg   SpO2 92%   BMI 15.19 kg/m   Physical Exam  Vitals and nursing note reviewed.    Constitutional:       General: She is not in acute distress.     Appearance: She is well-developed.   HENT:      Head: Normocephalic and atraumatic.   Eyes:      Conjunctiva/sclera: Conjunctivae normal.   Cardiovascular:      Rate and Rhythm: Normal rate and regular rhythm.      Heart sounds: No murmur heard.  Pulmonary:      Effort: Pulmonary effort is normal. No respiratory distress.      Breath sounds: Normal breath sounds.   Abdominal:      Palpations: Abdomen is soft.      Tenderness: There is abdominal tenderness (Right lower quadrant). There is no right CVA tenderness or left CVA tenderness.   Musculoskeletal:         General: No swelling.      Cervical back: Neck supple.   Skin:     General: Skin is warm and dry.      Capillary Refill: Capillary refill takes less than 2 seconds.   Neurological:      Mental Status: She is alert.   Psychiatric:         Mood and Affect: Mood normal.     ED Results / Procedures / Treatments    Labs  (all labs ordered are listed, but only abnormal results are displayed)  Labs Reviewed   BASIC METABOLIC PANEL - Abnormal; Notable for the following components:       Result Value    Glucose, Bld 117 (*)     Calcium 8.6 (*)     Anion gap 4 (*)     All other components within normal limits   URINALYSIS, ROUTINE W REFLEX MICROSCOPIC - Abnormal; Notable for the following components:    APPearance HAZY (*)     Nitrite POSITIVE (*)     Leukocytes,Ua SMALL (*)     Bacteria, UA RARE (*)     All other components within normal limits   CBC WITH DIFFERENTIAL/PLATELET   LIPASE, BLOOD     EKG  None    Radiology  CT ABDOMEN PELVIS WO CONTRAST    Result Date: 03/17/2022  CLINICAL DATA:  Abdominal pain. EXAM: CT ABDOMEN AND PELVIS WITHOUT CONTRAST TECHNIQUE: Multidetector CT imaging of the abdomen and pelvis was performed following the standard protocol without IV contrast. RADIATION DOSE REDUCTION: This exam was performed according to the departmental dose-optimization program which includes  automated exposure control, adjustment of the mA and/or kV according to patient size and/or use of iterative reconstruction technique. COMPARISON:  CT abdomen pelvis dated 11/18/2021. FINDINGS: Evaluation of this exam is limited in the absence of  intravenous contrast. Lower chest: Bibasilar subpleural atelectasis/scarring, left greater than right. There is coronary vascular calcification. No intra-abdominal free air or free fluid. Hepatobiliary: Small liver cysts. No imaging follow-up. The liver is otherwise unremarkable. No biliary dilatation. The gallbladder is unremarkable. Pancreas: The pancreas is unremarkable as visualized. Spleen: Normal in size without focal abnormality. Adrenals/Urinary Tract: The adrenal glands are unremarkable. There is an 8 mm nonobstructing right renal inferior pole calculus. No hydronephrosis. There is no hydronephrosis or nephrolithiasis on the left. Small left renal parapelvic cysts. There is mild right renal parenchyma atrophy. The urinary bladder is grossly unremarkable. Stomach/Bowel: Postsurgical changes of the bowel with anastomotic suture in the sigmoid colon. There is no bowel obstruction or active inflammation. The appendix is not visualized, likely surgically absent. Vascular/Lymphatic: Advanced aortoiliac atherosclerotic disease. The IVC is unremarkable. No portal venous gas. There is no adenopathy. Reproductive: The uterus is grossly unremarkable. No adnexal masses. Other: None Musculoskeletal: Osteopenia with degenerative changes of the spine. Old L3 compression fracture and vertebroplasty. There is mild compression fracture of the superior endplate of L2 with approximately 10-15% loss of vertebral body height, new since the prior CT and likely acute or subacute. Correlation with clinical exam and point tenderness recommended. IMPRESSION: 1. Mild compression fracture of the superior endplate of L2, new since the prior CT and likely acute or subacute. Correlation with  clinical exam and point tenderness recommended. 2. A 8 mm nonobstructing right renal inferior pole calculus. No hydronephrosis. 3. No bowel obstruction. 4.  Aortic Atherosclerosis (ICD10-I70.0). Electronically Signed   By: Anner Crete M.D.   On: 03/17/2022 20:27      Procedures  Procedures     Medications Ordered in ED  Medications   ketorolac (TORADOL) 15 MG/ML injection 15 mg (15 mg Intravenous Given 03/17/22 1932)   fosfomycin (MONUROL) packet 3 g (3 g Oral Given 03/17/22 2046)     ED Course/ Medical Decision Making/ A&P        Medical Decision Making  Amount and/or Complexity of Data Reviewed  Labs: ordered.  Radiology: ordered.    Risk  Prescription drug management.    This patient presents to the ED for concern of right lower quadrant pain and dysuria, this involves an extensive number of treatment options, and is a complaint that carries with it a high risk of complications and morbidity.  The differential diagnosis includes nephrolithiasis, pyelonephritis, cystitis, appendicitis, others    Co morbidities that complicate the patient evaluation    Patient requires daily in and out caths    Additional history obtained:    Additional history obtained from patient's son at bedside  External records from outside source obtained and reviewed including emergency department notes from January 17 with the patient was diagnosed with acute cystitis    Lab Tests:    I Ordered, and personally interpreted labs.  The pertinent results include: Grossly unremarkable CBC, BMP, lipase.  UA positive for nitrite, small leukocytes, rare bacteria    Imaging Studies ordered:    I ordered imaging studies including CT abdomen pelvis without contrast   I independently visualized and interpreted imaging which showed   1. Mild compression fracture of the superior endplate of L2, new   since the prior CT and likely acute or subacute. Correlation with   clinical exam and point tenderness recommended.   2. A 8 mm nonobstructing right renal  inferior pole calculus. No   hydronephrosis.   3. No bowel obstruction.   4.  Aortic Atherosclerosis  I agree with the radiologist interpretation    Problem List / ED Course / Critical interventions / Medication management    I ordered medication including Toradol for pain, fosfomycin for UTI  Reevaluation of the patient after these medicines showed that the patient improved  I have reviewed the patients home medicines and have made adjustments as needed    Social Determinants of Health:    Patient lives at home, has family members providing care.  No one providing in and out catheter procedure on weekends    Test / Admission - Considered:    The patient has urine suspicious for UTI.  She also has what appears to be a possible new superior endplate fracture at L2.  I feel this is likely the cause of the patient's pain.  Patient given fosfomycin to treat for UTI.  Plan to discharge home with prescription for anti-inflammatory medications and recommendations for follow-up with primary care.    Final Clinical Impression(s) / ED Diagnoses  Final diagnoses:   Closed fracture of second lumbar vertebra, unspecified fracture morphology, initial encounter ([facility])   Acute bacterial simple cystitis     Rx / DC Orders  ED Discharge Orders            Ordered     naproxen (NAPROSYN) 500 MG tablet  2 times daily         03/17/22 2122                 Ronny Bacon  03/17/22 2123      Teressa Lower, MD  03/17/22 2223    Electronically signed by Teressa Lower, MD at 03/17/2022 10:23 PM EST    Associated attestation - Teressa Lower, MD - 03/17/2022 10:23 PM EST  Formatting of this note might be different from the original.  Attestation: I provided a substantive portion of the care of this patient.  I personally made/approved the management plan for this patient and take responsibility for the patient management.     Patient seen emergency room for evaluation of right lower quadrant pain and right flank pain.  Workup  in the emergency room and concerning for UTI which was treated with fosfomycin given multiple allergies.  CT abdomen pelvis largely unremarkable.  Symptoms improved in the emergency department and patient discharged

## 2022-03-19 ENCOUNTER — Ambulatory Visit: Payer: MEDICARE

## 2022-03-29 ENCOUNTER — Inpatient Hospital Stay: Admit: 2022-03-29 | Payer: MEDICARE | Attending: Urology | Primary: Family Medicine

## 2022-03-29 DIAGNOSIS — N9489 Other specified conditions associated with female genital organs and menstrual cycle: Secondary | ICD-10-CM

## 2022-03-29 MED ORDER — GADOTERIDOL 279.3 MG/ML IV SOLN
279.3 | Freq: Once | INTRAVENOUS | Status: AC | PRN
Start: 2022-03-29 — End: 2022-03-29
  Administered 2022-03-29: 18:00:00 8 mL via INTRAVENOUS

## 2022-03-29 MED FILL — PROHANCE 279.3 MG/ML IV SOLN: 279.3 MG/ML | INTRAVENOUS | Qty: 20

## 2022-05-16 DEATH — deceased

## 2022-12-27 IMAGING — CT CT HEAD W/O CM
3 of 4 series · 15 of 47 positions shown, 18 images · non-contrast
Comparison: None Available.

CLINICAL DATA: Head trauma, moderate-severe



[Series 2: head w o · axial · 0.41mm/px · z∈[+5,+135]mm · 9 of 32 slices shown, 12 images]
[im 3/32  brain]
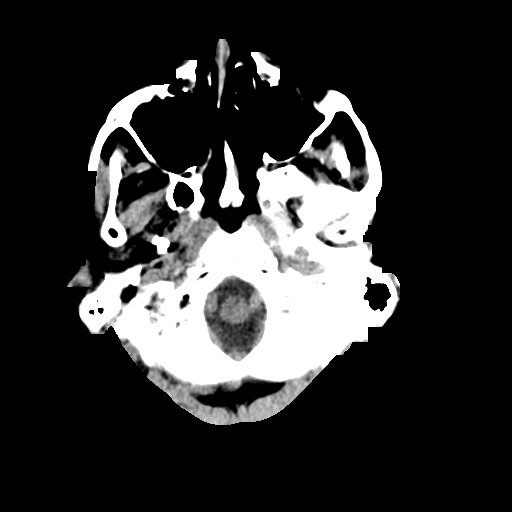
[im 3/32  bone]
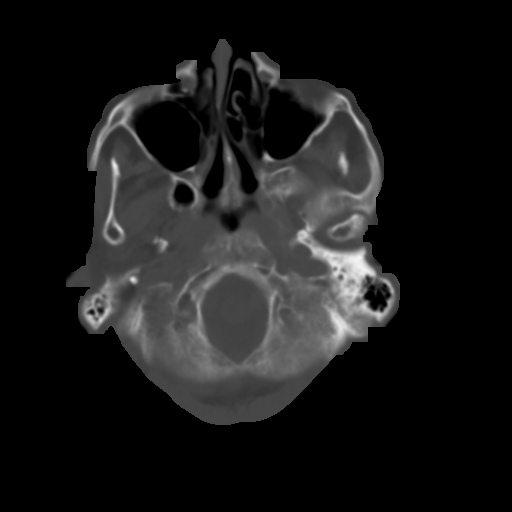
[im 7/32  brain]
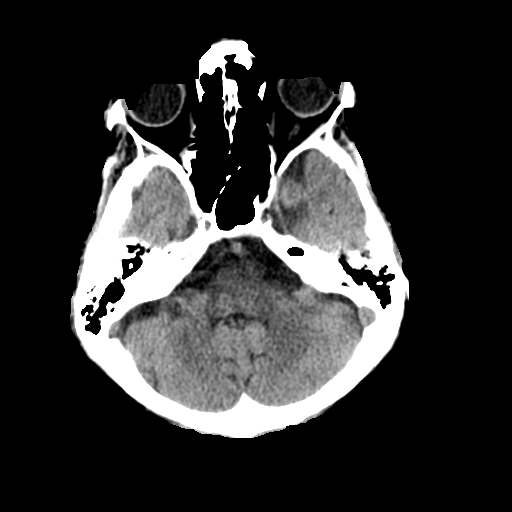
[im 9/32  brain]
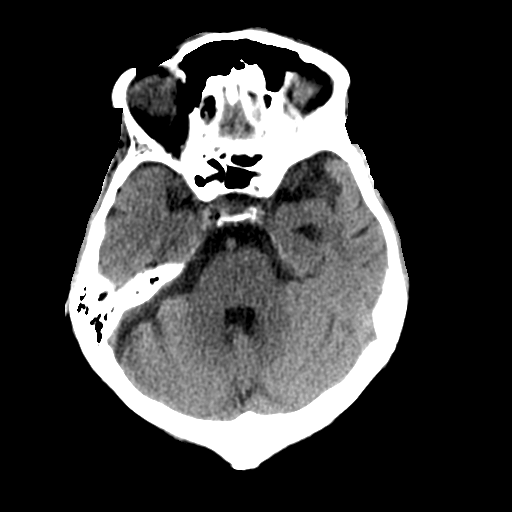
[im 14/32  brain]
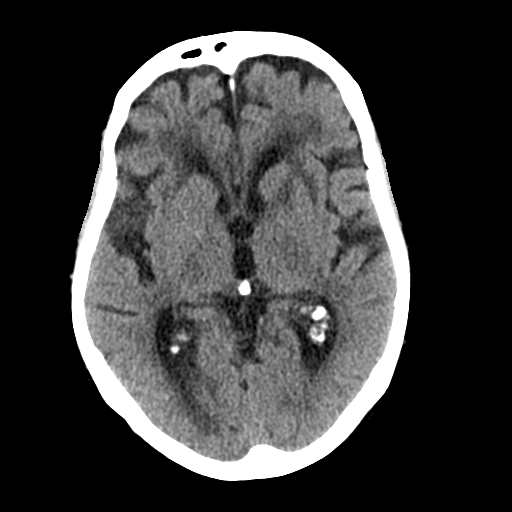
[im 16/32  brain]
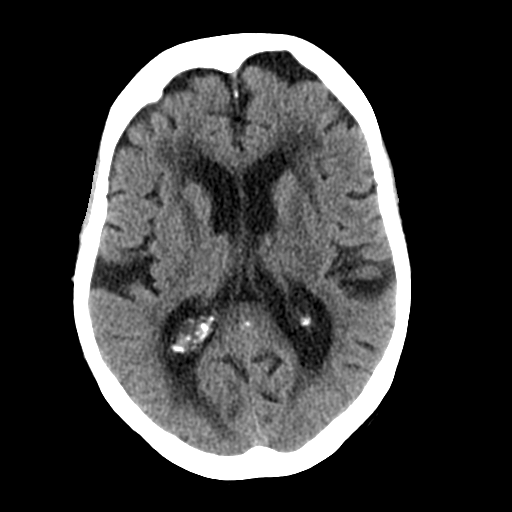
[im 16/32  bone]
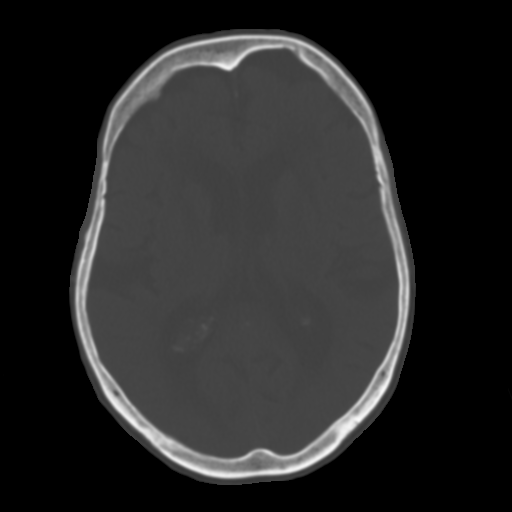
[im 18/32  brain]
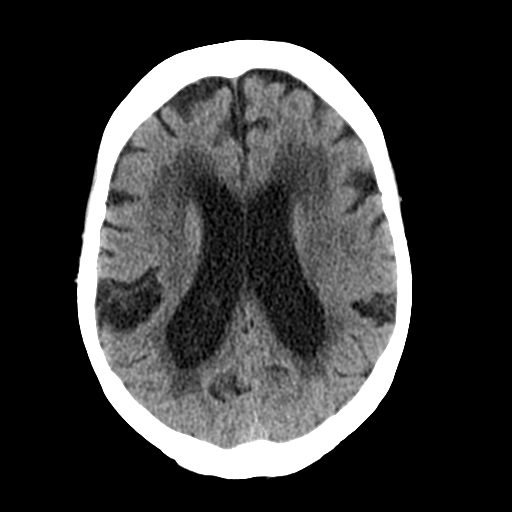
[im 23/32  brain]
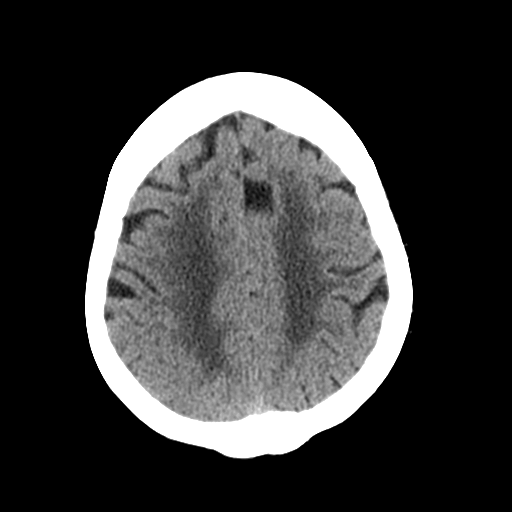
[im 25/32  brain]
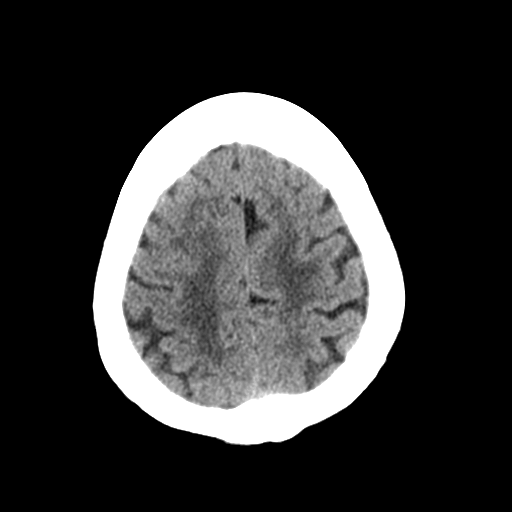
[im 29/32  brain]
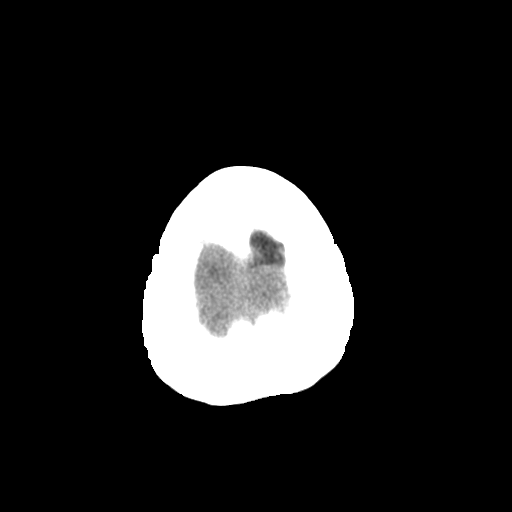
[im 29/32  bone]
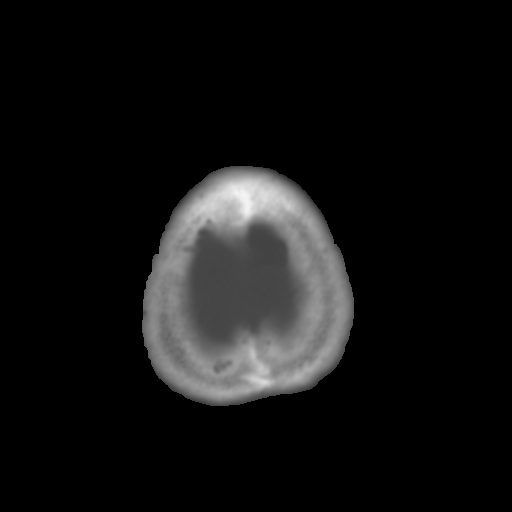

[Series 4: coronal soft · coronal · 0.33mm/px · 3 of 67 slices shown]
[im 23/67  brain]
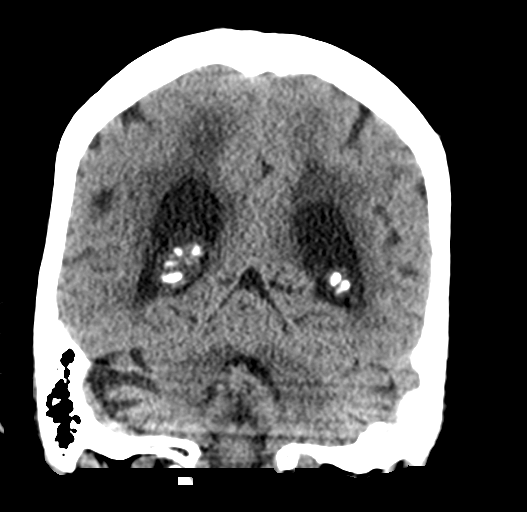
[im 30/67  brain]
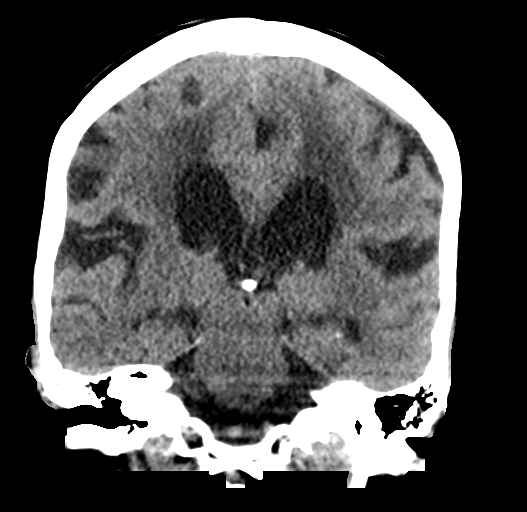
[im 37/67  brain]
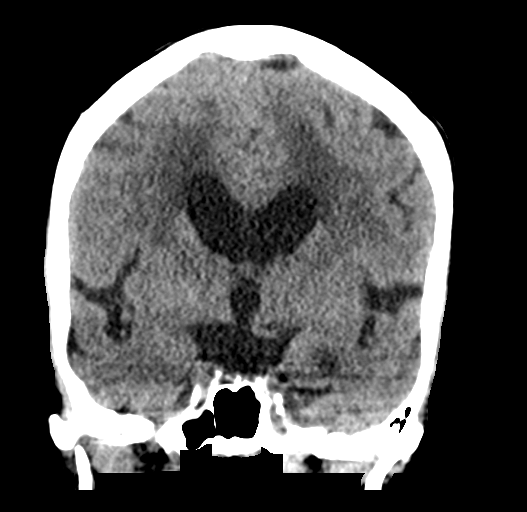

[Series 5: sagittal soft · sagittal · 0.34mm/px · 3 of 56 slices shown]
[im 19/56  brain]
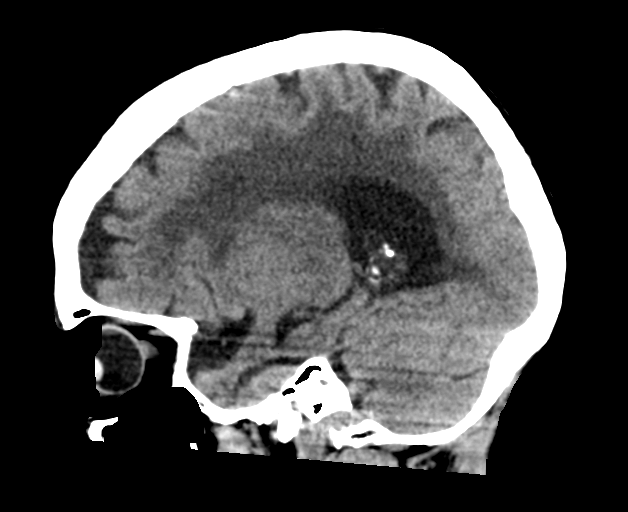
[im 28/56  brain]
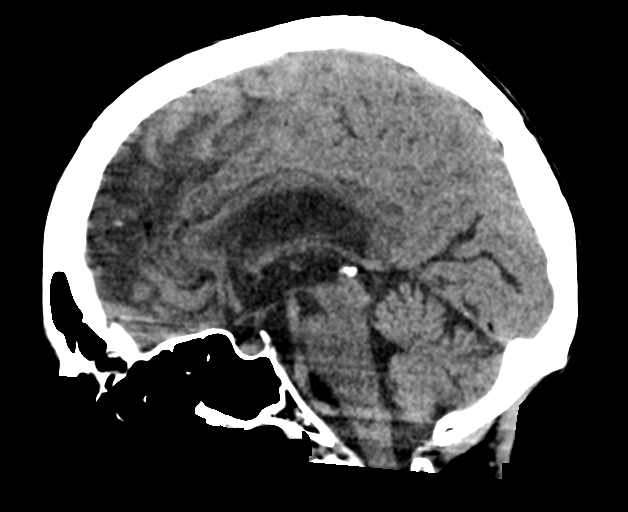
[im 37/56  brain]
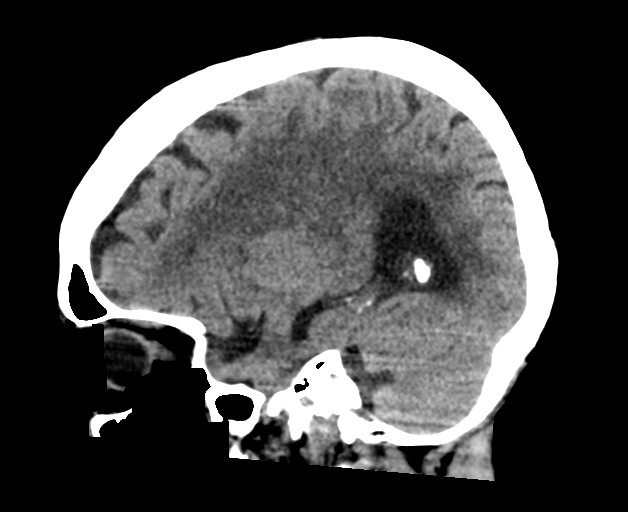

[15 of 47 positions shown; findings below may reference images not displayed]

BRAIN:
BRAIN
Cerebral ventricle sizes are concordant with the degree of cerebral
volume loss. Patchy and confluent areas of decreased attenuation are
noted throughout the deep and periventricular white matter of the
cerebral hemispheres bilaterally, compatible with chronic
microvascular ischemic disease.

No evidence of large-territorial acute infarction. No parenchymal
hemorrhage. No mass lesion. No extra-axial collection.

No mass effect or midline shift. No hydrocephalus. Basilar cisterns
are patent.

Vascular: No hyperdense vessel. Atherosclerotic calcifications are
present within the cavernous internal carotid arteries.

Skull: No acute fracture or focal lesion.

Sinuses/Orbits: Paranasal sinuses and mastoid air cells are clear.
Right lens replacement. Otherwise the orbits are unremarkable.

Other: None.
IMPRESSION: No acute intracranial abnormality.

## 2022-12-27 IMAGING — CT CT L SPINE W/O CM
4 of 5 series · 14 of 34 positions shown, 16 images · non-contrast
Comparison: CT angiography abdominal aorta 06/27/2010. CT abdomen
pelvis 12/20/2017. MRI thoracolumbar spine 07/07/2010

CLINICAL DATA: Back trauma, no prior imaging (Age >= 16y); Ataxia,
thoracic trauma. Status post fall. Back pain worse with movement.

EXAM:
CT THORACIC AND LUMBAR SPINE WITHOUT CONTRAST
TECHNIQUE: Multidetector CT imaging of the thoracic and lumbar spine was
performed without contrast. Multiplanar CT image reconstructions
were also generated.
RADIATION DOSE REDUCTION: This exam was performed according to the
departmental dose-optimization program which includes automated
exposure control, adjustment of the mA and/or kV according to
patient size and/or use of iterative reconstruction technique.

[Series 6: l spine soft · axial · 0.29mm/px · z∈[-510,-444]mm · 3 of 117 slices shown]
[im 17/117  soft-tissue]
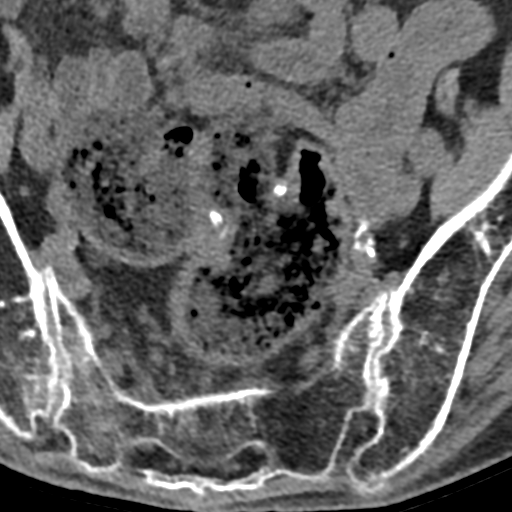
[im 34/117  soft-tissue]
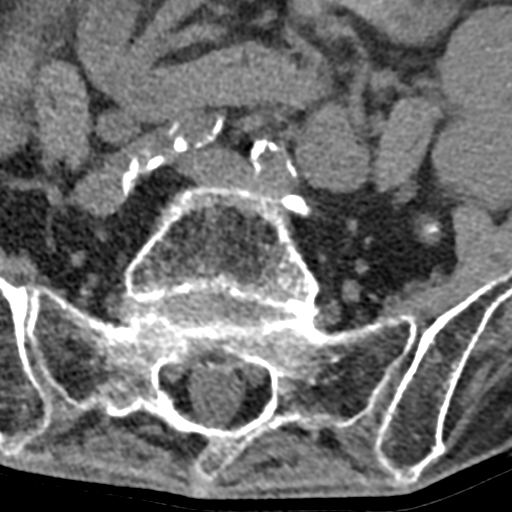
[im 50/117  soft-tissue]
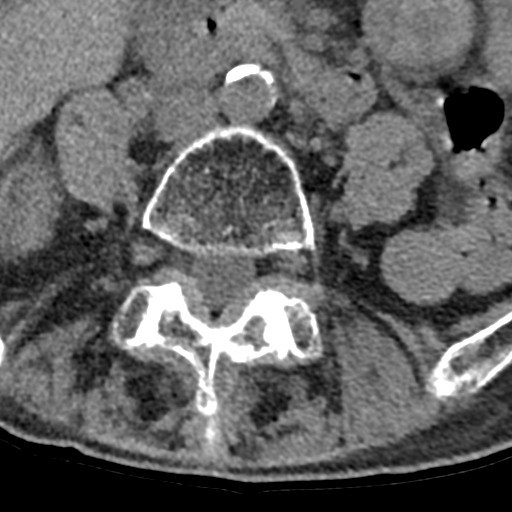

[Series 7: sagittal bone · sagittal · 0.33mm/px · 4 of 61 slices shown, 5 images]
[im 13/61  soft-tissue]
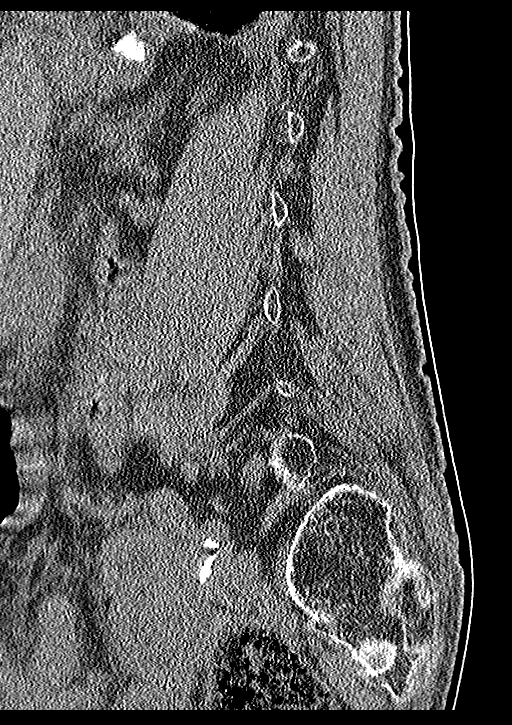
[im 13/61  bone]
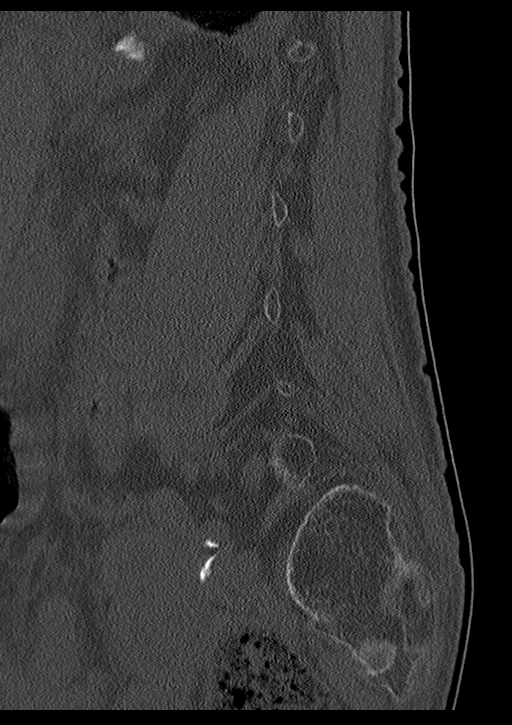
[im 25/61  bone]
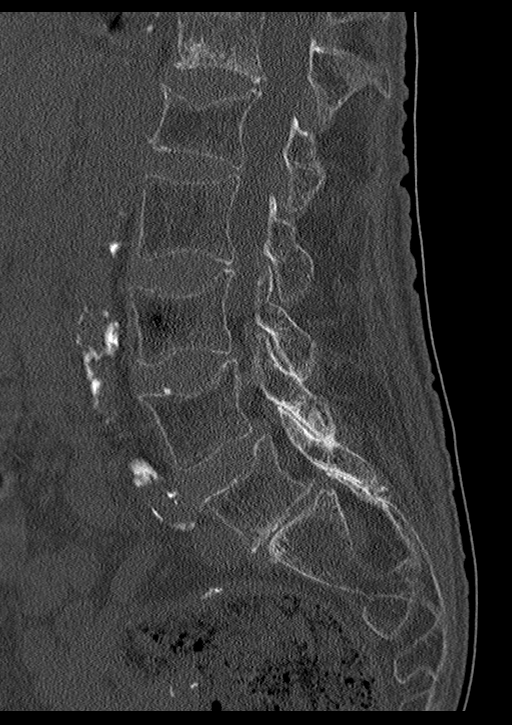
[im 37/61  bone]
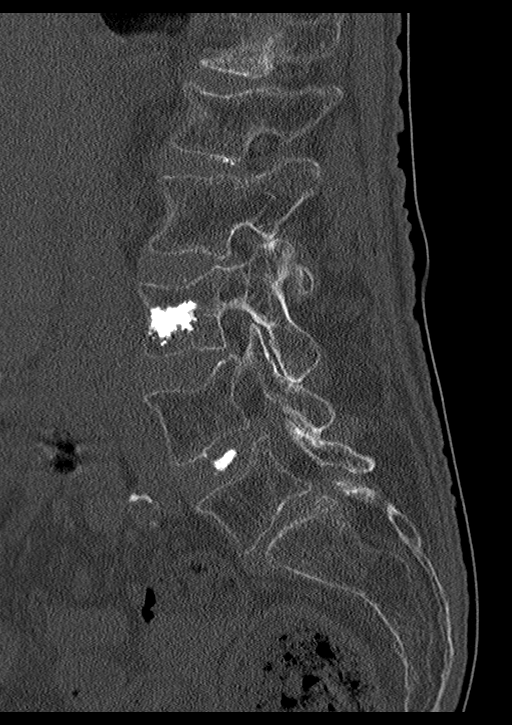
[im 49/61  bone]
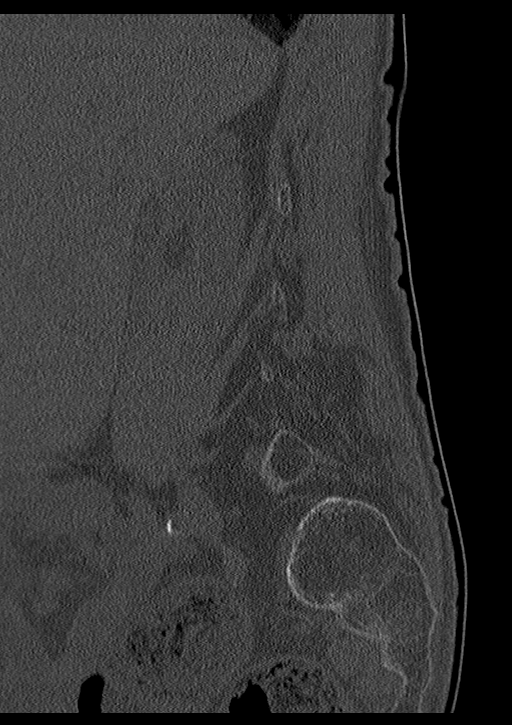

[Series 9: coronal bone · coronal · 0.34mm/px · 3 of 77 slices shown]
[im 16/77  bone]
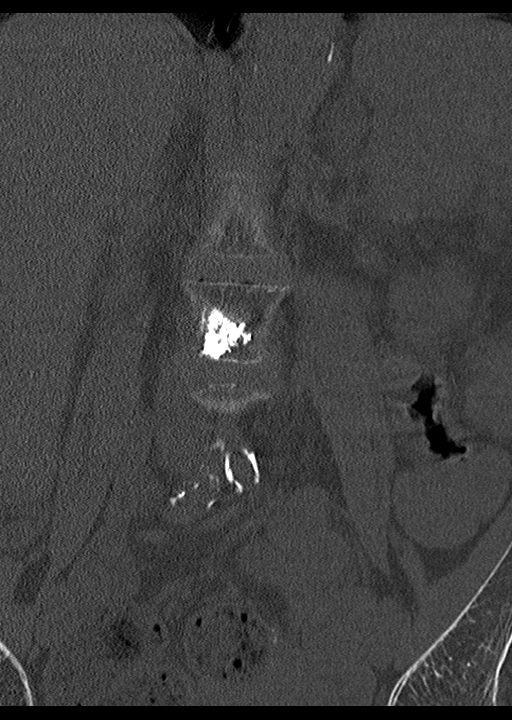
[im 31/77  bone]
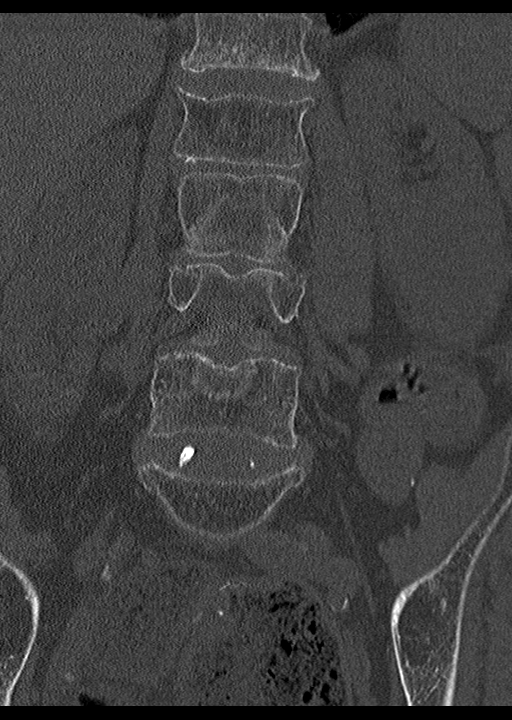
[im 46/77  bone]
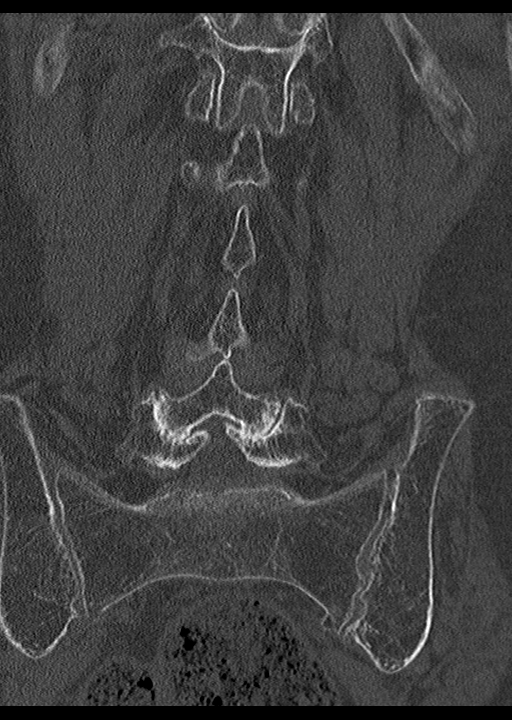

[Series 10: ax disc · axial · 0.21mm/px · z∈[-464,-346]mm · 4 of 88 slices shown, 5 images]
[im 18/88  soft-tissue]
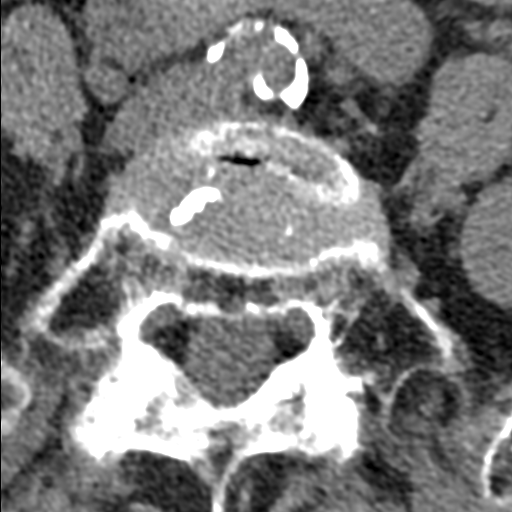
[im 18/88  bone]
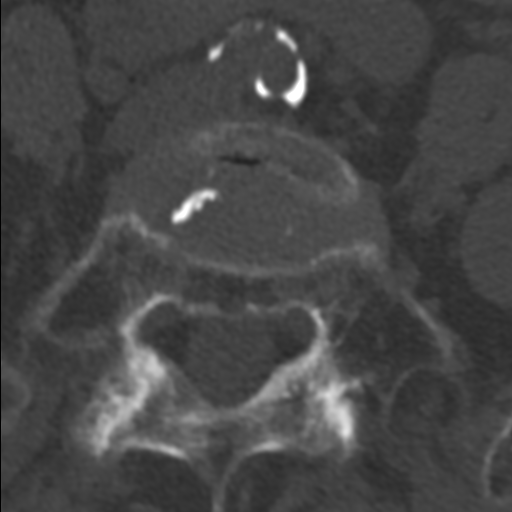
[im 35/88  bone]
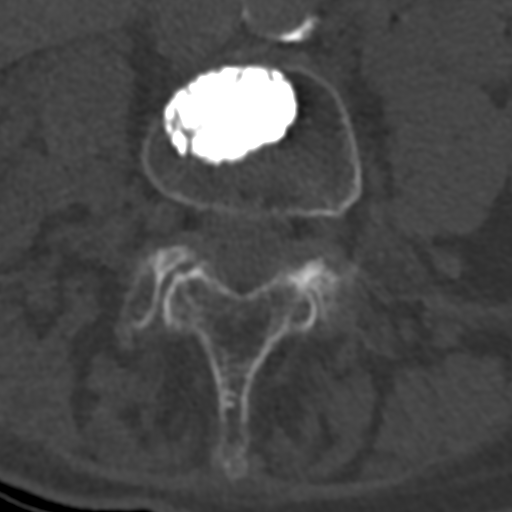
[im 53/88  bone]
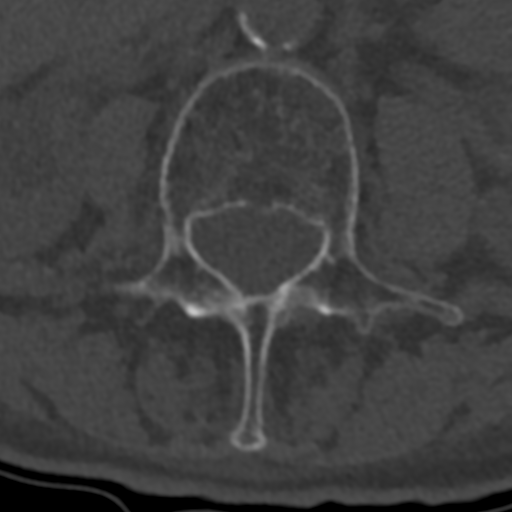
[im 70/88  bone]
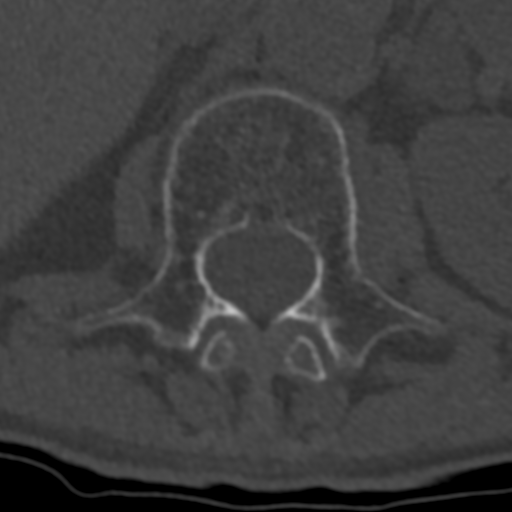

[14 of 34 positions shown; findings below may reference images not displayed]

FINDINGS: CT THORACIC SPINE FINDINGS

Alignment: 12 rib-bearing thoracic vertebral bodies. Normal
alignment.

Vertebrae: Chronic appearing T11 complete burst fracture with at
least 60% height loss and 4 mm retropulsion into the central canal.

Paraspinal and other soft tissues: Negative.

Disc levels: Maintained.

CT LUMBAR SPINE FINDINGS

Segmentation: 6 lumbar type vertebrae. These are numbered L1 through
L6.

Alignment: Normal.

Vertebrae: Age-indeterminate inferior endplate L1 compression
fracture with at least 20% height loss. Similar-appearing
kyphoplasty of the L3 level. Multilevel mild facet arthropathy.
L5-S1 endplate sclerosis.

Paraspinal and other soft tissues: Negative.

Disc levels: L5-S1 intervertebral disc space narrowing.

Other: Aortic calcification. Sigmoid surgical changes.
Nonobstructive 2 mm right nephrolithiasis.
IMPRESSION: CT THORACIC SPINE IMPRESSION

Chronic appearing T11 complete burst fracture with at least 60%
height loss and 4 mm retropulsion into the central canal. Recommend
clinical correlation with tenderness to palpation to evaluate for an
acute component.

CT LUMBAR SPINE IMPRESSION

Age-indeterminate inferior endplate L1 compression fracture with at
least 20% height loss. Recommend clinical correlation with
tenderness to palpation to evaluate for an acute component.

Aortic Atherosclerosis ([id number]-T07.7).

## 2022-12-30 IMAGING — CT CT ABD-PELV W/O CM
2 of 4 series · 16 of 46 positions shown, 18 images · non-contrast
Comparison: 12/20/2017

CLINICAL DATA: Left lower quadrant pain



[Series 2: axial st · axial · 0.62mm/px · z∈[-976,-626]mm · 13 of 78 slices shown, 15 images]
[im 4/78  soft-tissue]
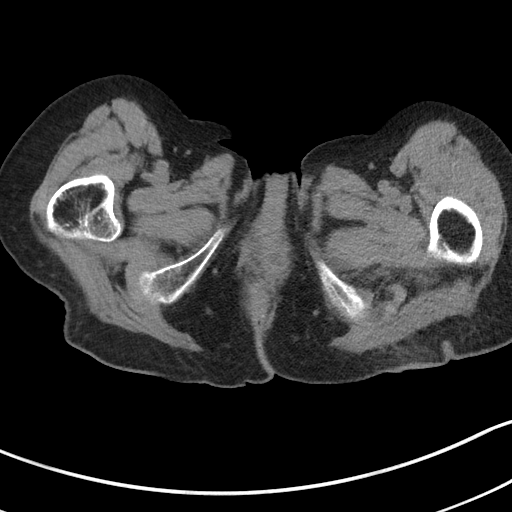
[im 4/78  bone]
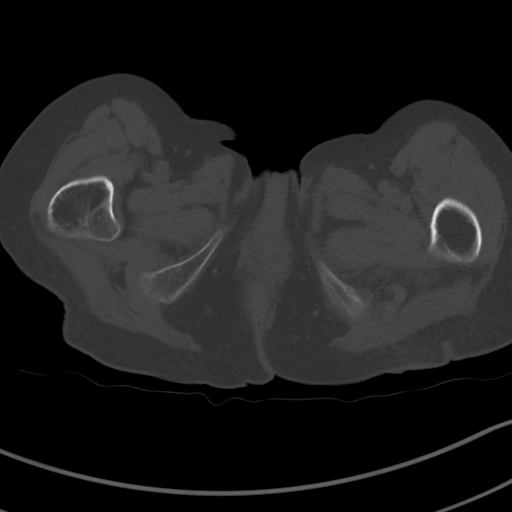
[im 12/78  soft-tissue]
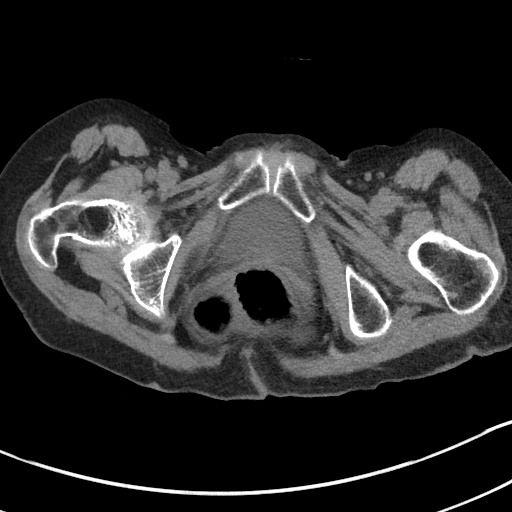
[im 16/78  soft-tissue]
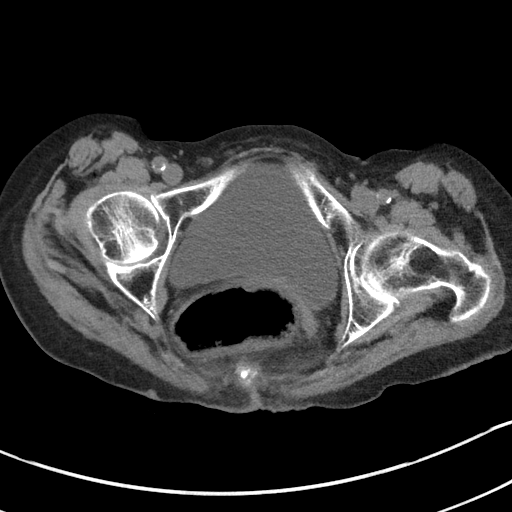
[im 24/78  soft-tissue]
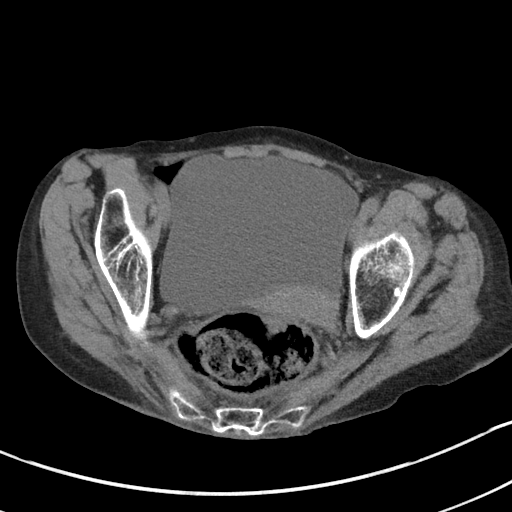
[im 27/78  soft-tissue]
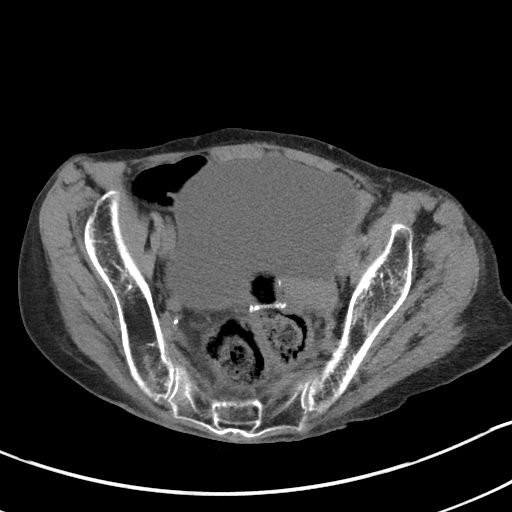
[im 35/78  soft-tissue]
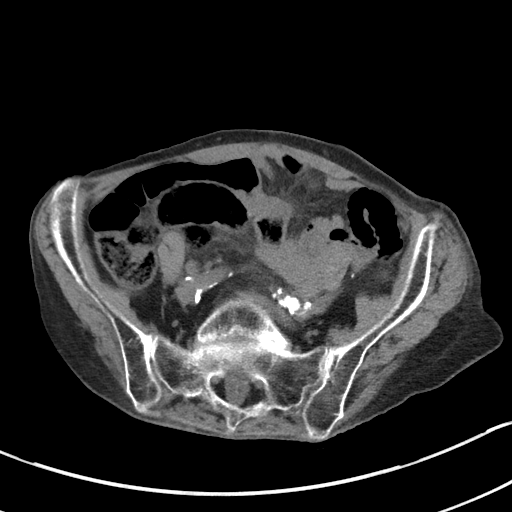
[im 39/78  soft-tissue]
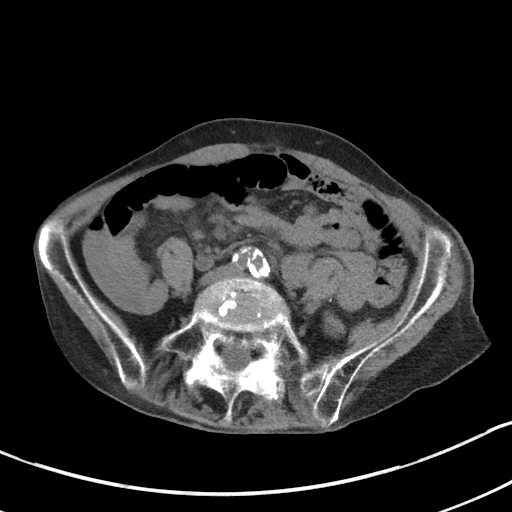
[im 43/78  soft-tissue]
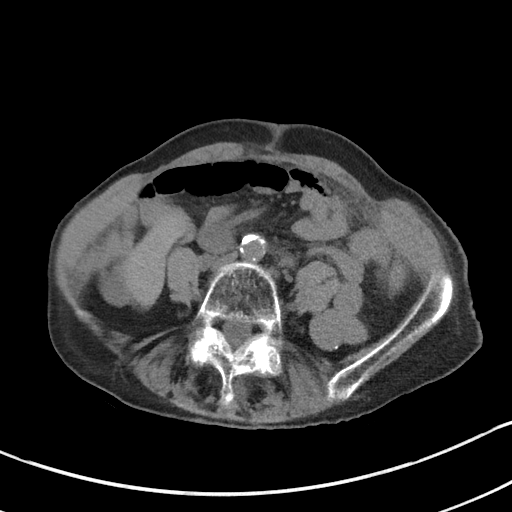
[im 51/78  soft-tissue]
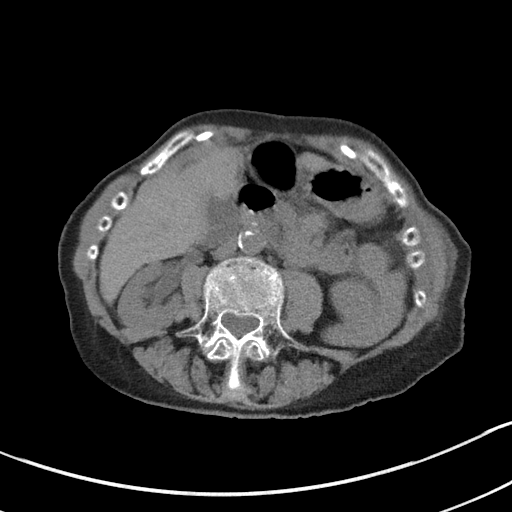
[im 51/78  bone]
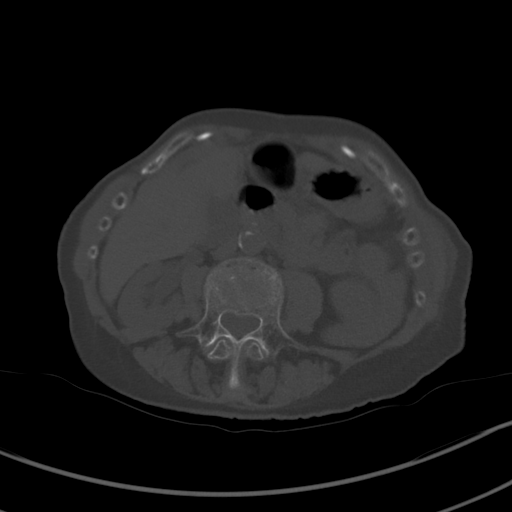
[im 54/78  soft-tissue]
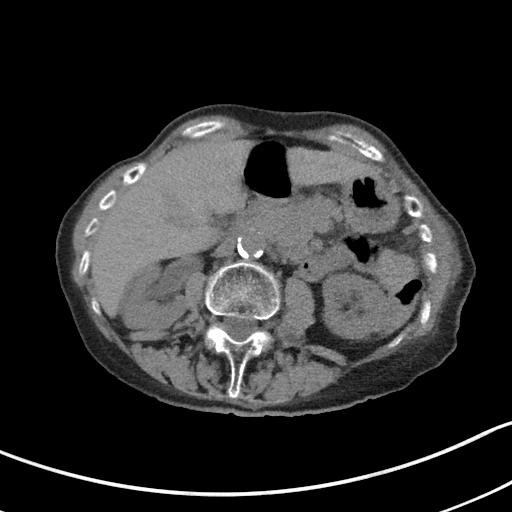
[im 62/78  soft-tissue]
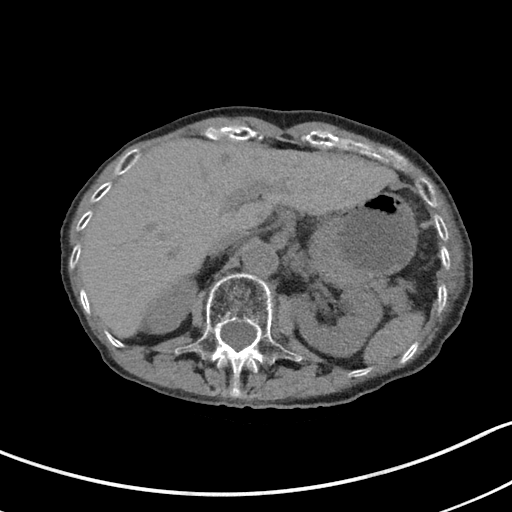
[im 66/78  soft-tissue]
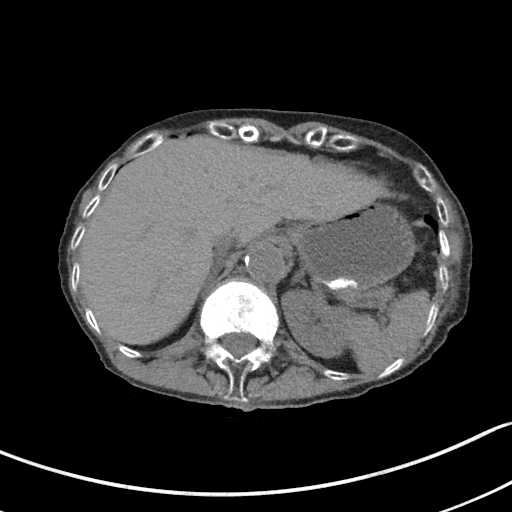
[im 74/78  soft-tissue]
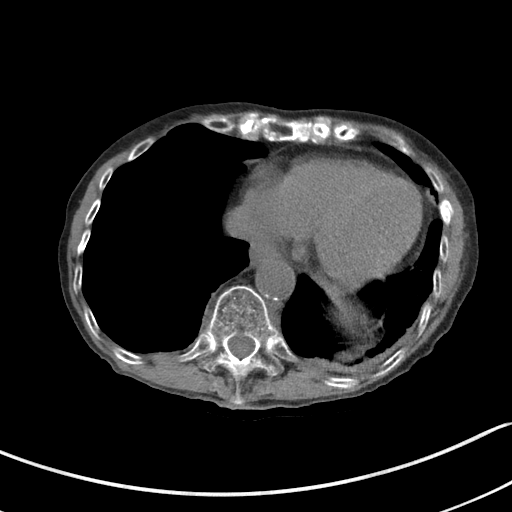

[Series 5: coronal st · coronal · 0.77mm/px · 3 of 141 slices shown]
[im 47/141  soft-tissue]
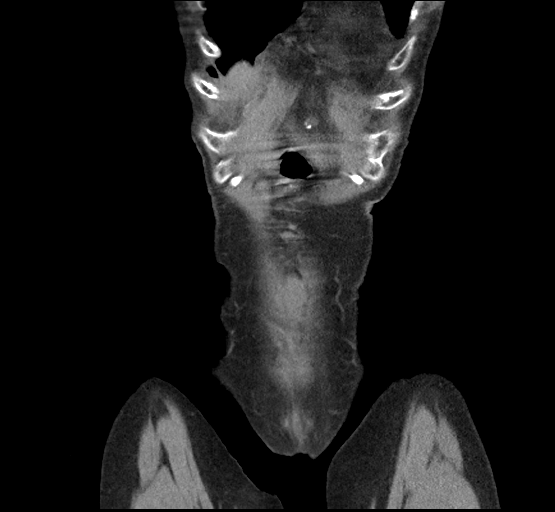
[im 63/141  soft-tissue]
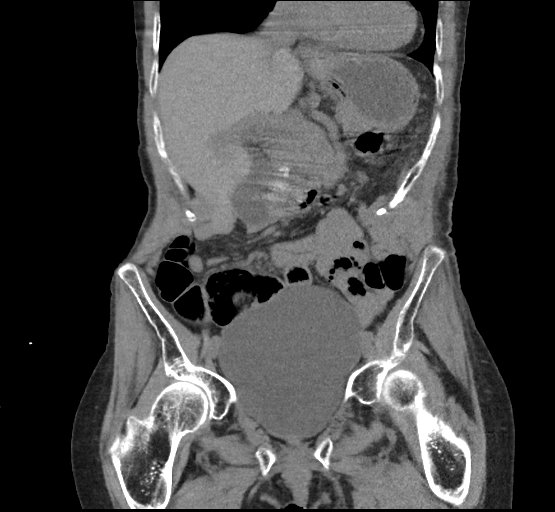
[im 78/141  soft-tissue]
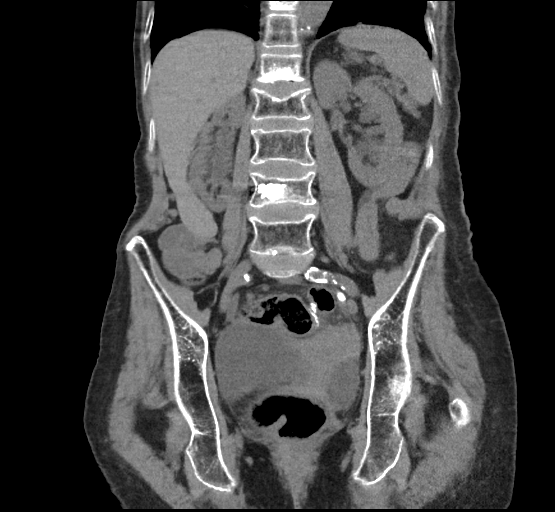

[16 of 46 positions shown; findings below may reference images not displayed]

FINDINGS: Lower chest: Mild scarring is noted in the bases bilaterally.

Hepatobiliary: Gallbladder is within normal limits. Liver
demonstrates a few scattered small cysts. No other focal abnormality
is noted.

Pancreas: Unremarkable. No pancreatic ductal dilatation or
surrounding inflammatory changes.

Spleen: Normal in size without focal abnormality.

Adrenals/Urinary Tract: Adrenal glands are within normal limits.
Kidneys are well visualized bilaterally. Nonobstructing renal
calculi are noted in the lower pole of the right kidney measuring up
to 5 mm. Fullness of the right renal collecting system and right
ureter is seen secondary to a distal right ureteral stone which
measures approximately 4 mm. This is best visualized on image number
58 of series 2. The bladder is well distended.

Stomach/Bowel: Scattered diverticular changes noted. Subtotal
colectomy is seen with widely patent small bowel colon anastomosis.
More proximal small bowel shows no obstructive change. Stomach is
within normal limits.

Vascular/Lymphatic: Diffuse atherosclerotic calcifications are
noted. No sizable adenopathy is seen.

Reproductive: Uterus and bilateral adnexa are unremarkable.

Other: No abdominal wall hernia or abnormality. No abdominopelvic
ascites.

Musculoskeletal: Chronic T12 compression deformity is noted
inferiorly. Changes of prior vertebral augmentation are seen at L3
IMPRESSION: 4 mm distal right ureteral stone with obstructive change.

Obstructing right renal calculi.

Diverticulosis and changes of prior subtotal colectomy.
